# Patient Record
Sex: Female | Born: 1950 | Race: White | Hispanic: No | Marital: Married | Smoking: Never smoker
Health system: Southern US, Community
[De-identification: ages and names within clinical notes are randomized; demographics above are authoritative.]

## PROBLEM LIST (undated history)

## (undated) DIAGNOSIS — K635 Polyp of colon: Secondary | ICD-10-CM

## (undated) DIAGNOSIS — IMO0002 Reserved for concepts with insufficient information to code with codable children: Secondary | ICD-10-CM

## (undated) DIAGNOSIS — Z79890 Hormone replacement therapy (postmenopausal): Secondary | ICD-10-CM

## (undated) DIAGNOSIS — R87619 Unspecified abnormal cytological findings in specimens from cervix uteri: Secondary | ICD-10-CM

## (undated) DIAGNOSIS — I10 Essential (primary) hypertension: Secondary | ICD-10-CM

## (undated) DIAGNOSIS — M1990 Unspecified osteoarthritis, unspecified site: Secondary | ICD-10-CM

## (undated) DIAGNOSIS — R87629 Unspecified abnormal cytological findings in specimens from vagina: Secondary | ICD-10-CM

## (undated) DIAGNOSIS — Z889 Allergy status to unspecified drugs, medicaments and biological substances status: Secondary | ICD-10-CM

## (undated) HISTORY — DX: Unspecified osteoarthritis, unspecified site: M19.90

## (undated) HISTORY — DX: Unspecified abnormal cytological findings in specimens from vagina: R87.629

## (undated) HISTORY — PX: OTHER SURGICAL HISTORY: SHX169

## (undated) HISTORY — DX: Reserved for concepts with insufficient information to code with codable children: IMO0002

## (undated) HISTORY — PX: SHOULDER SURGERY: SHX246

## (undated) HISTORY — DX: Unspecified abnormal cytological findings in specimens from cervix uteri: R87.619

## (undated) HISTORY — DX: Allergy status to unspecified drugs, medicaments and biological substances: Z88.9

## (undated) HISTORY — PX: WRIST SURGERY: SHX841

## (undated) HISTORY — DX: Hormone replacement therapy: Z79.890

---

## 2001-02-06 ENCOUNTER — Ambulatory Visit (HOSPITAL_COMMUNITY): Admission: RE | Admit: 2001-02-06 | Discharge: 2001-02-06 | Payer: Self-pay | Admitting: Obstetrics and Gynecology

## 2001-02-06 ENCOUNTER — Encounter: Payer: Self-pay | Admitting: Obstetrics and Gynecology

## 2001-02-27 ENCOUNTER — Other Ambulatory Visit: Admission: RE | Admit: 2001-02-27 | Discharge: 2001-02-27 | Payer: Self-pay | Admitting: Obstetrics and Gynecology

## 2002-04-03 ENCOUNTER — Encounter: Payer: Self-pay | Admitting: Specialist

## 2002-04-03 ENCOUNTER — Ambulatory Visit (HOSPITAL_COMMUNITY): Admission: RE | Admit: 2002-04-03 | Discharge: 2002-04-03 | Payer: Self-pay | Admitting: Specialist

## 2003-01-16 ENCOUNTER — Ambulatory Visit (HOSPITAL_COMMUNITY): Admission: RE | Admit: 2003-01-16 | Discharge: 2003-01-16 | Payer: Self-pay | Admitting: Internal Medicine

## 2003-01-16 ENCOUNTER — Encounter: Payer: Self-pay | Admitting: Internal Medicine

## 2003-04-09 ENCOUNTER — Encounter: Payer: Self-pay | Admitting: Specialist

## 2003-04-09 ENCOUNTER — Ambulatory Visit (HOSPITAL_COMMUNITY): Admission: RE | Admit: 2003-04-09 | Discharge: 2003-04-09 | Payer: Self-pay | Admitting: Specialist

## 2004-04-27 ENCOUNTER — Ambulatory Visit (HOSPITAL_COMMUNITY): Admission: RE | Admit: 2004-04-27 | Discharge: 2004-04-27 | Payer: Self-pay | Admitting: Specialist

## 2005-06-01 ENCOUNTER — Ambulatory Visit (HOSPITAL_COMMUNITY): Admission: RE | Admit: 2005-06-01 | Discharge: 2005-06-01 | Payer: Self-pay | Admitting: Specialist

## 2006-06-11 ENCOUNTER — Ambulatory Visit (HOSPITAL_COMMUNITY): Admission: RE | Admit: 2006-06-11 | Discharge: 2006-06-11 | Payer: Self-pay | Admitting: Specialist

## 2007-06-13 ENCOUNTER — Ambulatory Visit (HOSPITAL_COMMUNITY): Admission: RE | Admit: 2007-06-13 | Discharge: 2007-06-13 | Payer: Self-pay | Admitting: Specialist

## 2007-09-05 ENCOUNTER — Other Ambulatory Visit: Admission: RE | Admit: 2007-09-05 | Discharge: 2007-09-05 | Payer: Self-pay | Admitting: Obstetrics & Gynecology

## 2007-10-17 ENCOUNTER — Ambulatory Visit (HOSPITAL_COMMUNITY): Admission: RE | Admit: 2007-10-17 | Discharge: 2007-10-17 | Payer: Self-pay | Admitting: Internal Medicine

## 2008-06-16 ENCOUNTER — Ambulatory Visit (HOSPITAL_COMMUNITY): Admission: RE | Admit: 2008-06-16 | Discharge: 2008-06-16 | Payer: Self-pay | Admitting: Obstetrics & Gynecology

## 2008-09-22 ENCOUNTER — Other Ambulatory Visit: Admission: RE | Admit: 2008-09-22 | Discharge: 2008-09-22 | Payer: Self-pay | Admitting: Obstetrics & Gynecology

## 2009-02-18 ENCOUNTER — Encounter: Payer: Self-pay | Admitting: Internal Medicine

## 2009-02-24 ENCOUNTER — Ambulatory Visit (HOSPITAL_COMMUNITY): Admission: RE | Admit: 2009-02-24 | Discharge: 2009-02-24 | Payer: Self-pay | Admitting: Internal Medicine

## 2009-02-24 ENCOUNTER — Ambulatory Visit: Payer: Self-pay | Admitting: Internal Medicine

## 2009-06-18 ENCOUNTER — Ambulatory Visit (HOSPITAL_COMMUNITY): Admission: RE | Admit: 2009-06-18 | Discharge: 2009-06-18 | Payer: Self-pay | Admitting: Obstetrics & Gynecology

## 2009-07-08 ENCOUNTER — Ambulatory Visit (HOSPITAL_COMMUNITY): Admission: RE | Admit: 2009-07-08 | Discharge: 2009-07-08 | Payer: Self-pay | Admitting: Obstetrics & Gynecology

## 2009-07-28 ENCOUNTER — Ambulatory Visit (HOSPITAL_COMMUNITY): Admission: RE | Admit: 2009-07-28 | Discharge: 2009-07-28 | Payer: Self-pay | Admitting: Internal Medicine

## 2009-10-18 ENCOUNTER — Other Ambulatory Visit: Admission: RE | Admit: 2009-10-18 | Discharge: 2009-10-18 | Payer: Self-pay | Admitting: Obstetrics & Gynecology

## 2010-05-18 ENCOUNTER — Encounter: Admission: RE | Admit: 2010-05-18 | Discharge: 2010-05-18 | Payer: Self-pay | Admitting: Orthopedic Surgery

## 2010-06-22 ENCOUNTER — Ambulatory Visit
Admission: RE | Admit: 2010-06-22 | Discharge: 2010-06-22 | Payer: Self-pay | Source: Home / Self Care | Admitting: Orthopedic Surgery

## 2010-07-25 ENCOUNTER — Ambulatory Visit (HOSPITAL_COMMUNITY)
Admission: RE | Admit: 2010-07-25 | Discharge: 2010-07-25 | Payer: Self-pay | Source: Home / Self Care | Admitting: Obstetrics & Gynecology

## 2010-09-10 ENCOUNTER — Encounter: Payer: Self-pay | Admitting: Obstetrics & Gynecology

## 2010-09-10 ENCOUNTER — Encounter: Payer: Self-pay | Admitting: Specialist

## 2010-10-10 NOTE — Op Note (Signed)
NAMECARMELL, [name]               ACCOUNT NO.:  [id number]  MEDICAL RECORD NO.:  [id number]          PATIENT TYPE:  AMB  LOCATION:  DSC                          FACILITY:  [facility]  PHYSICIAN:  Cindee Salt, M.D.       DATE OF BIRTH:  January 26, 1951  DATE OF PROCEDURE:  06/22/2010 DATE OF DISCHARGE:                              OPERATIVE REPORT   PREOPERATIVE DIAGNOSIS:  Triangular fibrocartilage complex tears, scapholunate lunotriquetral tear with volar ulnar ligament avulsion, right wrist.  POSTOPERATIVE DIAGNOSIS:  Triangular fibrocartilage complex tears, scapholunate lunotriquetral tear with volar ulnar ligament avulsion, right wrist.  OPERATION:  Arthroscopy, right wrist with debridement triangular fibrocartilage complex tear, lateral triquetral tear, lunotriquetral tear, a volar ulnar wrist ligament avulsion, right wrist.  SURGEON:  Cindee Salt, MD  ASSISTANT:  Joaquin Courts, RN  ANESTHESIA:  General.  ANESTHESIOLOGIST:  Janetta Hora. Gelene Mink, MD  HISTORY:  The patient is a 60 year old female with a history of right wrist pain.  This has not responded to conservative treatment.  MRI reveals that she has a TFCC tear with cyst at the volar aspect of her lunate triquetrum indicative of a volar ulnar ligament injury.  She has scapholunate and lunotriquetral ligament tears.  She is aware that there is no guarantee with the surgery, possibility of infection, recurrence, injury to arteries, nerves, tendons, incomplete relief of symptoms, or dystrophy.  She has elected to undergo arthroscopic inspection, possible repairs, possibility of simple debridement with more definitive procedure at some point in the future determined by findings and by symptom resolution.  In the preoperative area, the patient is seen, the extremity marked by both the patient and surgeon, and antibiotic given.  PROCEDURE IN DETAIL:  The patient was brought to the operating room where a general anesthetic was  carried out without difficulty after a axillary block.  She was prepped using ChloraPrep, supine position with the right arm free.  A 3-minute dry time was allowed and time-out taken confirming the patient and procedure.  The limb was placed in the ARC arthroscopy tower and 10 pounds of traction applied.  The joint inflated through the 3-4 portal.  Care was taken to protect the EPL tendon.  A hemostat was used to enter the spread tissue down to the joint.  A blunt trocar was used to enter the joint.  The joint was inspected.  A large scapholunate ligament tear with significant rotation of scaphoid lunate was immediately apparent.  A moderate synovitis was present over the entire wrist.  The scope was then brought to the ulnar aspect.  The irrigation catheter placed in 6U.  A large volar ulnar ligament tear was immediately apparent of both the ulnar lunate and the ulnar triquetral ligaments.  These were brought down into the joint and were significantly shortened.  A probe was then introduced through the 4-5 portal after localization with a 22-gauge needle.  This allowed the ligaments to be pushed back up into a normal position, although barely able to reach the lunate.  The scope was then introduced in the 6R portal.  This allowed visualization of the volar portion of the  lunate. A large defect of articular surface was present on the lunate.  A small TFCC tear was present on the most volar aspect, not involving the condensation.  The scope was then introduced into the tear on the volar aspect.  The cystic change in the lunate was immediately apparent.  This was unable to be probed due to its location on the volar aspect.  The scope was then reintroduced in the 3-4 portal.  The TFCC was probed. The ulna was not visible.  The joint was then inspected through the ulnar midcarpal portal after localization with a 22-gauge needle. Transverse incision made and deepened with a hemostat.  A blunt  trocar was used to enter the joint and the joint was inspected.  A very significant erosion of the proximal hamate was noted, instability of both scapholunate lunotriquetral joints were noted from the midcarpal joint.  The proximal capitate showed a very minimal change on its most ulnar aspect.  The scope was reintroduced into the proximal 3-4 portal. The articular surface of the distal radius lunate facet showed some minor changes, but the articular of cartilage was present.  A thorough debridement was then performed of the lunotriquetral, the volar ulnar ligaments, the TFCC, and the scapholunate with a coude shaver.  A ArthroWand was then used for hemostasis.  The instruments were removed. The portals were closed with interrupted 5-0 Vicryl Rapide sutures. Sterile compressive dressing, volar splint applied to the wrist only with the fingers free.  The patient tolerated the procedure well and was taken to the recovery room for observation in satisfactory condition. She will be discharged to home to return to the [facility] [place] in 1 week on Vicodin.          ______________________________ Cindee Salt, M.D.     GK/MEDQ  D:  06/22/2010  T:  06/23/2010  Job:  [id number]  cc:   Kingsley Callander. Ouida Sills, MD  Electronically Signed by Cindee Salt M.D. on 10/10/2010 12:20:15 PM

## 2010-10-21 ENCOUNTER — Other Ambulatory Visit: Payer: Self-pay | Admitting: Obstetrics & Gynecology

## 2010-10-21 ENCOUNTER — Other Ambulatory Visit (HOSPITAL_COMMUNITY)
Admission: RE | Admit: 2010-10-21 | Discharge: 2010-10-21 | Disposition: A | Discharge status: Home / Self Care | Payer: 59 | Source: Ambulatory Visit | Attending: Obstetrics & Gynecology | Admitting: Obstetrics & Gynecology

## 2010-10-21 DIAGNOSIS — Z01419 Encounter for gynecological examination (general) (routine) without abnormal findings: Secondary | ICD-10-CM | POA: Insufficient documentation

## 2010-11-01 LAB — POCT HEMOGLOBIN-HEMACUE: Hemoglobin: 13 g/dL (ref 12.0–15.0)

## 2011-01-03 NOTE — Op Note (Signed)
[name], MERIDA               ACCOUNT NO.:  [id number]   MEDICAL RECORD NO.:  [id number]          PATIENT TYPE:  AMB   LOCATION:  DAY                           FACILITY:  [facility]   PHYSICIAN:  R. Roetta Sessions, M.D. DATE OF BIRTH:  August 05, 1951   DATE OF PROCEDURE:  02/24/2009  DATE OF DISCHARGE:                               OPERATIVE REPORT   PROCEDURE:  Screening colonoscopy.   INDICATIONS FOR PROCEDURE:  A 60 year old lady sent over at the courtesy  Dr. Carylon Perches for colorectal cancer screening.  She is devoid of any  lower GI tract symptoms.  She has never had her lower GI tract  evaluated.  Reportedly, her sister was diagnosed with colon cancer in  her late 55s and her mother was diagnosed in her 34s.  [name] is now  undergoing her first ever colonoscopy.  The risks, benefits,  alternatives and limitations have been discussed, questions answered.  Please see the documentation in the medical record.   PROCEDURE NOTE:  O2 saturation, blood pressure and pulse oximeter.  The  patient was monitored throughout the entire procedure.  Conscious  sedation with Versed 5 mg IV and Demerol 75 mg IV in divided doses.   INSTRUMENT:  Pentax video chip system.   FINDINGS:  Digital rectal exam revealed no abnormalities.   ENDOSCOPIC FINDINGS:  Prep was adequate.  Colon:  The colonic mucosa was  surveyed from the rectosigmoid junction through the left transverse  right colon to the appendiceal orifice, ileocecal valve and cecum.  These structures were well seen and photographed for the record.  From  this level, the scope was slowly and cautiously withdrawn.  All  previously mentioned mucosal surfaces were again seen.  The colonic  mucosa appeared normal.  The scope was pulled down in the rectum where a  thorough examination of the rectal mucosa, including retroflexion of the  anal verge demonstrated no abnormalities.  The patient tolerated the  procedure well and was reacted in  endoscopy.  Cecal withdrawal time 6  minutes.   IMPRESSION:  1. Normal rectum.  2. Normal colon.   RECOMMENDATIONS:  Repeat screening colonoscopy in 5 years.      Jonathon Bellows, M.D.  Electronically Signed     RMR/MEDQ  D:  02/24/2009  T:  02/24/2009  Job:  [id number]   cc:   Kingsley Callander. Ouida Sills, MD  Fax: [phone]

## 2011-05-22 ENCOUNTER — Ambulatory Visit (HOSPITAL_BASED_OUTPATIENT_CLINIC_OR_DEPARTMENT_OTHER)
Admission: RE | Admit: 2011-05-22 | Discharge: 2011-05-22 | Disposition: A | Discharge status: Home / Self Care | Payer: Managed Care, Other (non HMO) | Source: Ambulatory Visit | Attending: Orthopedic Surgery | Admitting: Orthopedic Surgery

## 2011-05-22 DIAGNOSIS — M6530 Trigger finger, unspecified finger: Secondary | ICD-10-CM | POA: Insufficient documentation

## 2011-05-22 DIAGNOSIS — M65839 Other synovitis and tenosynovitis, unspecified forearm: Secondary | ICD-10-CM | POA: Insufficient documentation

## 2011-05-22 LAB — POCT HEMOGLOBIN-HEMACUE: Hemoglobin: 13 g/dL (ref 12.0–15.0)

## 2011-05-23 NOTE — Op Note (Signed)
  NAMESHALISA, [name] NO.:  [id number]  MEDICAL RECORD NO.:  [id number]  LOCATION:                                 FACILITY:  PHYSICIAN:  Cindee Salt, M.D.            DATE OF BIRTH:  DATE OF PROCEDURE:  05/22/2011 DATE OF DISCHARGE:                              OPERATIVE REPORT   PREOPERATIVE DIAGNOSIS:  Stenosing tenosynovitis right thumb.  POSTOPERATIVE DIAGNOSIS:  Stenosing tenosynovitis right thumb.  OPERATION:  Release A1 pulley right thumb.  SURGEON:  Cindee Salt, MD  ANESTHESIA:  Forearm based IV regional with local filtration.  ANESTHESIOLOGIST:  Janetta Hora. Gelene Mink, MD  HISTORY:  The patient is a 60 year old female with a history of triggering of her right thumb.  This has not responded to conservative treatment.  Pre, peri and postoperative course have been discussed along with risks and complications.  She is aware there is no guarantee with surgery, possibility of infection, recurrence injury to arteries, nerves, tendons, incomplete relief of symptoms and dystrophy. Preoperative area, the patient is seen.  The extremity marked by both the patient and surgeon.  Antibiotic given.  PROCEDURE:  The patient was brought to the operating room where a forearm based IV regional anesthetic was carried out without difficulty. She was prepped using ChloraPrep, supine position, right arm free.  A 3- minute dry time was allowed.  Time-out taken confirming the patient and procedure.  A longitudinal transverse incision was made over the A1 pulley of the right thumb, carried down through subcutaneous tissue. Bleeders were electrocauterized with bipolar.  The digital neurovascular bundles were identified and protected.  Retractors were placed.  The A1 pulley was then released on its radial aspect.  A locked area was immediately encountered.  After release, the thumb was able to be placed through a full range of motion, no further lesions were identified.   The wound was irrigated.  Skin closed with interrupted 4-0 Vicryl Rapide sutures.  Local infiltration of 0.25% Marcaine without epinephrine was given, approximately 4 mL was used.  A sterile compressive dressing was applied.  On deflation of the tourniquet, all fingers immediately pinked.  She was taken to the recovery room for observation in satisfactory condition. She will be discharged home to return to the [facility] of [place] in 1 week on Vicodin.          ______________________________ Cindee Salt, M.D.     GK/MEDQ  D:  05/22/2011  T:  05/23/2011  Job:  [id number]  Electronically Signed by Cindee Salt M.D. on 05/23/2011 04:41:17 PM

## 2011-06-12 ENCOUNTER — Emergency Department (HOSPITAL_COMMUNITY): Payer: Managed Care, Other (non HMO)

## 2011-06-12 ENCOUNTER — Emergency Department (HOSPITAL_COMMUNITY)
Admission: EM | Admit: 2011-06-12 | Discharge: 2011-06-12 | Disposition: A | Discharge status: Home / Self Care | Payer: Managed Care, Other (non HMO) | Attending: Emergency Medicine | Admitting: Emergency Medicine

## 2011-06-12 ENCOUNTER — Encounter: Payer: Self-pay | Admitting: *Deleted

## 2011-06-12 DIAGNOSIS — W010XXA Fall on same level from slipping, tripping and stumbling without subsequent striking against object, initial encounter: Secondary | ICD-10-CM | POA: Insufficient documentation

## 2011-06-12 DIAGNOSIS — S82892A Other fracture of left lower leg, initial encounter for closed fracture: Secondary | ICD-10-CM

## 2011-06-12 DIAGNOSIS — S82899A Other fracture of unspecified lower leg, initial encounter for closed fracture: Secondary | ICD-10-CM | POA: Insufficient documentation

## 2011-06-12 MED ORDER — OXYCODONE-ACETAMINOPHEN 5-325 MG PO TABS: 1.0000 | ORAL_TABLET | ORAL | Status: AC | PRN

## 2011-06-12 MED ORDER — OXYCODONE-ACETAMINOPHEN 5-325 MG PO TABS
1.0000 | ORAL_TABLET | Freq: Once | ORAL | Status: AC
  Administered 2011-06-12: 1 via ORAL
  Filled 2011-06-12: qty 1

## 2011-06-12 NOTE — ED Provider Notes (Signed)
History     CSN: [id number] Arrival date & time: 06/12/2011  7:30 PM   First MD Initiated Contact with Patient 06/12/11 2000      Chief Complaint  Patient presents with  . Ankle Pain    (Consider location/radiation/quality/duration/timing/severity/associated sxs/prior treatment) Patient is a 60 y.o. female presenting with ankle pain. The history is provided by the patient and the spouse.  Ankle Pain  The incident occurred 3 to 5 hours ago. The incident occurred at home. The injury mechanism was a fall and torsion. The pain is present in the left ankle. The quality of the pain is described as aching and throbbing. The pain is moderate. The pain has been constant since onset. Associated symptoms include inability to bear weight. Pertinent negatives include no numbness, no loss of motion, no muscle weakness, no loss of sensation and no tingling. She reports no foreign bodies present. The symptoms are aggravated by activity, bearing weight and palpation. She has tried NSAIDs for the symptoms. The treatment provided no relief.    History reviewed. No pertinent past medical history.  Past Surgical History  Procedure Date  . Wrist surgery   . Shoulder surgery   . Thumb surger     No family history on file.  History  Substance Use Topics  . Smoking status: Never Smoker   . Smokeless tobacco: Not on file  . Alcohol Use: No    OB History    Grav Para Term Preterm Abortions TAB SAB Ect Mult Living                  Review of Systems  Constitutional: Negative for fever, chills and fatigue.  HENT: Negative for neck pain and neck stiffness.   Respiratory: Negative for cough, shortness of breath and wheezing.   Cardiovascular: Negative for chest pain and palpitations.  Gastrointestinal: Negative for vomiting.  Genitourinary: Negative for dysuria, hematuria and flank pain.  Musculoskeletal: Positive for joint swelling and arthralgias. Negative for myalgias and back pain.  Skin:  Negative.  Negative for rash.  Neurological: Negative for dizziness, tingling, weakness and numbness.  Hematological: Does not bruise/bleed easily.  All other systems reviewed and are negative.    Allergies  Review of patient's allergies indicates no known allergies.  Home Medications   Current Outpatient Rx  Name Route Sig Dispense Refill  . CALCIUM 600 + D PO Oral Take 1 tablet by mouth daily.      . IBUPROFEN 200 MG PO TABS Oral Take 200 mg by mouth as needed. For pain     . ONE-A-DAY WOMENS 50 PLUS PO Oral Take 1 tablet by mouth daily.      Janetta Hora ESTRADIOL 0.5-2.5 MG-MCG PO TABS Oral Take 1 tablet by mouth daily.        BP 152/76  Pulse 79  Temp(Src) 98.2 F (36.8 C) (Oral)  Resp 16  Ht 5\' 2"  (1.575 m)  Wt 125 lb (56.7 kg)  BMI 22.86 kg/m2  SpO2 100%  Physical Exam  Nursing note and vitals reviewed. Constitutional: She is oriented to person, place, and time. She appears well-developed and well-nourished. No distress.  HENT:  Head: Normocephalic and atraumatic.  Mouth/Throat: Oropharynx is clear and moist.  Neck: Normal range of motion. Neck supple.  Cardiovascular: Normal rate, regular rhythm and normal heart sounds.   Pulmonary/Chest: Effort normal and breath sounds normal. No respiratory distress. She exhibits no tenderness.  Musculoskeletal: Normal range of motion. She exhibits no tenderness.  Left ankle: She exhibits swelling. She exhibits normal range of motion, no ecchymosis, no deformity, no laceration and normal pulse. tenderness. Lateral malleolus and medial malleolus tenderness found. No head of 5th metatarsal and no proximal fibula tenderness found. Achilles tendon normal.  Lymphadenopathy:    She has no cervical adenopathy.  Neurological: She is alert and oriented to person, place, and time. No cranial nerve deficit or sensory deficit. She exhibits normal muscle tone. Coordination normal.  Reflex Scores:      Achilles reflexes are 2+ on  the right side and 2+ on the left side. Skin: Skin is warm and dry.    ED Course  ORTHOPEDIC INJURY TREATMENT Date/Time: 06/12/2011 8:55 PM Performed by: Trisha Mangle, Edynn Gillock L. Authorized by: Maxwell Caul Consent: Verbal consent obtained. Written consent not obtained. Consent given by: patient Patient understanding: patient states understanding of the procedure being performed Patient consent: the patient's understanding of the procedure matches consent given Procedure consent: procedure consent matches procedure scheduled Imaging studies: imaging studies available Patient identity confirmed: verbally with patient Time out: Immediately prior to procedure a "time out" was called to verify the correct patient, procedure, equipment, support staff and site/side marked as required. Injury location: ankle Location details: left ankle Injury type: fracture Fracture type: lateral malleolus Pre-procedure neurovascular assessment: neurovascularly intact Pre-procedure distal perfusion: normal Pre-procedure neurological function: normal Pre-procedure range of motion: reduced Local anesthesia used: no Patient sedated: no Manipulation performed: no Immobilization: splint and crutches Splint type: velcro aso. Post-procedure neurovascular assessment: post-procedure neurovascularly intact Post-procedure distal perfusion: normal Post-procedure neurological function: normal Post-procedure range of motion: unchanged Patient tolerance: Patient tolerated the procedure well with no immediate complications.   (including critical care time)  Dg Ankle Complete Left  06/12/2011  *RADIOLOGY REPORT*  Clinical Data: Pain and swelling of the lateral malleolus.  The patient twisted ankle and fell today.  LEFT ANKLE COMPLETE - 3+ VIEW  Comparison: None.  Findings: There is soft tissue swelling along the lateral aspect of the ankle.  Faint bone density is identified distal to the lateral malleolus, consist  with small avulsion type injury.  No other fractures are identified.  Small plantar are calcaneal spur identified.  IMPRESSION:  Small avulsion type injury of the lateral malleolus.  Original Report Authenticated By: Patterson Hammersmith, M.D.        MDM     ttp  With moderate STS of the lateral left ankle.  DP pulse is intact, sensation is also intact.  CR<2 sec.  PAtien feeling better after splint applied.  Agrees to close ortho follow-up.    Patient / Family / Caregiver understand and agree with initial ED impression and plan with expectations set for ED visit.   OUTPATIENT MEDICATIONS PRESCRIBED FROM THE ED:   New Prescriptions   OXYCODONE-ACETAMINOPHEN (PERCOCET) 5-325 MG PER TABLET    Take 1 tablet by mouth every 4 (four) hours as needed for pain.            Malike Foglio L. Anyssa Sharpless, [place] 06/16/11 1431

## 2011-06-12 NOTE — ED Notes (Signed)
Pt states she slipped on wet leaves and left ankle twisted. NAD

## 2011-06-20 NOTE — ED Provider Notes (Signed)
Medical screening examination/treatment/procedure(s) were conducted as a shared visit with non-physician practitioner(s) and myself.  I personally evaluated the patient during the encounter.  X-ray of left ankle shows small avulsion of lateral malleolus.  Neurovascular intact  Donnetta Hutching, MD 06/20/11 [phone]

## 2011-07-24 ENCOUNTER — Other Ambulatory Visit: Payer: Self-pay | Admitting: Obstetrics & Gynecology

## 2011-07-24 DIAGNOSIS — Z139 Encounter for screening, unspecified: Secondary | ICD-10-CM

## 2011-07-27 ENCOUNTER — Ambulatory Visit (HOSPITAL_COMMUNITY)
Admission: RE | Admit: 2011-07-27 | Discharge: 2011-07-27 | Disposition: A | Discharge status: Home / Self Care | Payer: 59 | Source: Ambulatory Visit | Attending: Obstetrics & Gynecology | Admitting: Obstetrics & Gynecology

## 2011-07-27 DIAGNOSIS — Z139 Encounter for screening, unspecified: Secondary | ICD-10-CM

## 2011-07-27 DIAGNOSIS — Z1231 Encounter for screening mammogram for malignant neoplasm of breast: Secondary | ICD-10-CM | POA: Insufficient documentation

## 2011-11-07 ENCOUNTER — Other Ambulatory Visit (HOSPITAL_COMMUNITY)
Admission: RE | Admit: 2011-11-07 | Discharge: 2011-11-07 | Disposition: A | Discharge status: Home / Self Care | Payer: 59 | Source: Ambulatory Visit | Attending: Obstetrics & Gynecology | Admitting: Obstetrics & Gynecology

## 2011-11-07 ENCOUNTER — Other Ambulatory Visit: Payer: Self-pay | Admitting: Obstetrics & Gynecology

## 2011-11-07 DIAGNOSIS — Z01419 Encounter for gynecological examination (general) (routine) without abnormal findings: Secondary | ICD-10-CM | POA: Insufficient documentation

## 2012-02-26 ENCOUNTER — Other Ambulatory Visit: Payer: Self-pay | Admitting: Obstetrics & Gynecology

## 2012-07-02 ENCOUNTER — Other Ambulatory Visit: Payer: Self-pay | Admitting: Obstetrics & Gynecology

## 2012-07-02 DIAGNOSIS — IMO0001 Reserved for inherently not codable concepts without codable children: Secondary | ICD-10-CM

## 2012-07-29 ENCOUNTER — Ambulatory Visit (HOSPITAL_COMMUNITY)
Admission: RE | Admit: 2012-07-29 | Discharge: 2012-07-29 | Disposition: A | Discharge status: Home / Self Care | Payer: 59 | Source: Ambulatory Visit | Attending: Obstetrics & Gynecology | Admitting: Obstetrics & Gynecology

## 2012-07-29 DIAGNOSIS — IMO0001 Reserved for inherently not codable concepts without codable children: Secondary | ICD-10-CM

## 2012-07-29 DIAGNOSIS — Z1231 Encounter for screening mammogram for malignant neoplasm of breast: Secondary | ICD-10-CM | POA: Insufficient documentation

## 2012-11-14 ENCOUNTER — Encounter: Payer: Self-pay | Admitting: Adult Health

## 2012-11-14 ENCOUNTER — Other Ambulatory Visit (HOSPITAL_COMMUNITY)
Admission: RE | Admit: 2012-11-14 | Discharge: 2012-11-14 | Disposition: A | Discharge status: Home / Self Care | Payer: 59 | Source: Ambulatory Visit | Attending: Obstetrics and Gynecology | Admitting: Obstetrics and Gynecology

## 2012-11-14 ENCOUNTER — Ambulatory Visit (INDEPENDENT_AMBULATORY_CARE_PROVIDER_SITE_OTHER): Payer: PRIVATE HEALTH INSURANCE | Admitting: Adult Health

## 2012-11-14 ENCOUNTER — Other Ambulatory Visit: Payer: Self-pay | Admitting: Adult Health

## 2012-11-14 VITALS — BP 120/80 | HR 68 | Ht 62.0 in | Wt 134.0 lb

## 2012-11-14 DIAGNOSIS — Z01419 Encounter for gynecological examination (general) (routine) without abnormal findings: Secondary | ICD-10-CM | POA: Insufficient documentation

## 2012-11-14 DIAGNOSIS — M1990 Unspecified osteoarthritis, unspecified site: Secondary | ICD-10-CM

## 2012-11-14 DIAGNOSIS — Z1151 Encounter for screening for human papillomavirus (HPV): Secondary | ICD-10-CM | POA: Insufficient documentation

## 2012-11-14 DIAGNOSIS — Z79890 Hormone replacement therapy (postmenopausal): Secondary | ICD-10-CM

## 2012-11-14 DIAGNOSIS — Z0000 Encounter for general adult medical examination without abnormal findings: Secondary | ICD-10-CM

## 2012-11-14 DIAGNOSIS — Z1212 Encounter for screening for malignant neoplasm of rectum: Secondary | ICD-10-CM

## 2012-11-14 HISTORY — DX: Hormone replacement therapy: Z79.890

## 2012-11-14 LAB — HEMOCCULT GUIAC POC 1CARD (OFFICE)

## 2012-11-14 NOTE — Progress Notes (Signed)
Subjective:     Patient ID: [name], female   DOB: 04/25/1951, 62 y.o.   MRN: [id number]  HPI Shanikia is a 62 year old white female, married in today for Pap and physical.   Review of Systems Patient denies any headaches, blurred vision, shortness of breath, chest pain, abdominal pain, problems with bowel movements, urination, or intercourse. Does have some pain in knees and hands occasionally. No mood changes. Reviewed past medical, surgical, social and family histories. Reviewed medications and allergies.     Objective:   Physical ExamVital signs: Blood pressure 120/80. weight 134 lbs., height 62 inches, pulse 68 and regular BMI 24. Skin: Warm and dry Neck: Midline trachea. Thyroid normal Lungs: Clear to auscultation bilaterally. Breasts: No dominant palpable mass, retraction, or nipple discharge. Cardiovascular: Regular rate and rhythm. Abdomen: Soft and non-tender, no hepatosplenomegaly. Pelvic: External genitalia is normal and appearance. The vagina is normal in appearance. Cervix is slightly atrophic with negative cervical motion tenderness. Uterus felt to be normal size shape and contour. No adnexal masses or tenderness noted. Rectal exam:Good sphincter, no polyps or hemorrhoids. Negative hemoccult card. Extremities: No swelling or varicose veins. Psych: No mood changes     Assessment:      Yearly exam     Plan:      Discussed stopping hormone replacement therapy, patient thinks she will stop after current pack, if any problems call. Mammogram yearly(had in 07-2012). Colonoscopy per GI.  Labs per Dr. Ouida Sills   Return in 1 year physical, prn problems.

## 2012-11-14 NOTE — Patient Instructions (Addendum)
Ok to try stopping hormones, mammogram yearly, colonoscopy per GI, Dr. Ouida Sills does labs. Physical yearly, if HPV - 3 years for pap, Sign up for my chart.

## 2013-04-02 ENCOUNTER — Other Ambulatory Visit (HOSPITAL_COMMUNITY): Payer: Self-pay | Admitting: Orthopaedic Surgery

## 2013-04-02 DIAGNOSIS — M25562 Pain in left knee: Secondary | ICD-10-CM

## 2013-04-07 ENCOUNTER — Other Ambulatory Visit (HOSPITAL_COMMUNITY): Payer: 59

## 2013-04-07 ENCOUNTER — Ambulatory Visit (HOSPITAL_COMMUNITY)
Admission: RE | Admit: 2013-04-07 | Discharge: 2013-04-07 | Disposition: A | Discharge status: Home / Self Care | Payer: 59 | Source: Ambulatory Visit | Attending: Orthopaedic Surgery | Admitting: Orthopaedic Surgery

## 2013-04-07 DIAGNOSIS — X58XXXA Exposure to other specified factors, initial encounter: Secondary | ICD-10-CM | POA: Insufficient documentation

## 2013-04-07 DIAGNOSIS — IMO0002 Reserved for concepts with insufficient information to code with codable children: Secondary | ICD-10-CM | POA: Insufficient documentation

## 2013-04-07 DIAGNOSIS — M7120 Synovial cyst of popliteal space [Baker], unspecified knee: Secondary | ICD-10-CM | POA: Insufficient documentation

## 2013-04-07 DIAGNOSIS — M25569 Pain in unspecified knee: Secondary | ICD-10-CM | POA: Insufficient documentation

## 2013-04-07 DIAGNOSIS — S83419A Sprain of medial collateral ligament of unspecified knee, initial encounter: Secondary | ICD-10-CM | POA: Insufficient documentation

## 2013-04-07 DIAGNOSIS — M25562 Pain in left knee: Secondary | ICD-10-CM

## 2013-04-07 DIAGNOSIS — M1710 Unilateral primary osteoarthritis, unspecified knee: Secondary | ICD-10-CM | POA: Insufficient documentation

## 2013-07-02 ENCOUNTER — Other Ambulatory Visit (HOSPITAL_COMMUNITY): Payer: Self-pay | Admitting: Internal Medicine

## 2013-07-02 DIAGNOSIS — Z139 Encounter for screening, unspecified: Secondary | ICD-10-CM

## 2013-07-31 ENCOUNTER — Ambulatory Visit (HOSPITAL_COMMUNITY)
Admission: RE | Admit: 2013-07-31 | Discharge: 2013-07-31 | Disposition: A | Discharge status: Home / Self Care | Payer: 59 | Source: Ambulatory Visit | Attending: Internal Medicine | Admitting: Internal Medicine

## 2013-07-31 DIAGNOSIS — Z1231 Encounter for screening mammogram for malignant neoplasm of breast: Secondary | ICD-10-CM | POA: Insufficient documentation

## 2013-07-31 DIAGNOSIS — Z139 Encounter for screening, unspecified: Secondary | ICD-10-CM

## 2013-11-17 ENCOUNTER — Ambulatory Visit (INDEPENDENT_AMBULATORY_CARE_PROVIDER_SITE_OTHER): Payer: PRIVATE HEALTH INSURANCE | Admitting: Obstetrics & Gynecology

## 2013-11-17 ENCOUNTER — Other Ambulatory Visit (HOSPITAL_COMMUNITY)
Admission: RE | Admit: 2013-11-17 | Discharge: 2013-11-17 | Disposition: A | Discharge status: Home / Self Care | Payer: 59 | Source: Ambulatory Visit | Attending: Obstetrics & Gynecology | Admitting: Obstetrics & Gynecology

## 2013-11-17 ENCOUNTER — Other Ambulatory Visit: Payer: PRIVATE HEALTH INSURANCE | Admitting: Adult Health

## 2013-11-17 ENCOUNTER — Encounter: Payer: Self-pay | Admitting: Obstetrics & Gynecology

## 2013-11-17 ENCOUNTER — Other Ambulatory Visit: Payer: PRIVATE HEALTH INSURANCE | Admitting: Obstetrics and Gynecology

## 2013-11-17 VITALS — BP 130/70 | Ht 62.0 in | Wt 132.0 lb

## 2013-11-17 DIAGNOSIS — Z1212 Encounter for screening for malignant neoplasm of rectum: Secondary | ICD-10-CM

## 2013-11-17 DIAGNOSIS — Z01419 Encounter for gynecological examination (general) (routine) without abnormal findings: Secondary | ICD-10-CM

## 2013-11-17 NOTE — Progress Notes (Signed)
Patient ID: [name], female   DOB: [birth year], 63 y.o.   MRN: [id number] Subjective:     [name] is a 63 y.o. female here for a routine exam.  No LMP recorded. Patient is postmenopausal. G2P2 Birth Control Method:  na Menstrual Calendar(currently): na  Current complaints: na.   Current acute medical issues:  arthritis   Recent Gynecologic History No LMP recorded. Patient is postmenopausal. Last Pap: 2014,  normal Last mammogram: 2015,  normal  Past Medical History  Diagnosis Date  . Hx of seasonal allergies   . Arthritis   . Abnormal Pap smear   . Postmenopausal HRT (hormone replacement therapy) 11/14/2012    Past Surgical History  Procedure Laterality Date  . Wrist surgery    . Shoulder surgery    . Thumb surger    . Lt knee surgery      OB History   Grav Para Term Preterm Abortions TAB SAB Ect Mult Living   2 2        2       History   Social History  . Marital Status: Married    Spouse Name: N/A    Number of Children: N/A  . Years of Education: N/A   Social History Main Topics  . Smoking status: Never Smoker   . Smokeless tobacco: Never Used  . Alcohol Use: No     Comment: occ  . Drug Use: No  . Sexual Activity: Yes    Birth Control/ Protection: None, Post-menopausal   Other Topics Concern  . None   Social History Narrative  . None    Family History  Problem Relation Age of Onset  . Cancer Mother     colon  . Diabetes Mother   . Cancer Sister     colon  . Diabetes Maternal Aunt   . Depression Sister   . Arthritis Sister      Review of Systems  Review of Systems  Constitutional: Negative for fever, chills, weight loss, malaise/fatigue and diaphoresis.  HENT: Negative for hearing loss, ear pain, nosebleeds, congestion, sore throat, neck pain, tinnitus and ear discharge.   Eyes: Negative for blurred vision, double vision, photophobia, pain, discharge and redness.  Respiratory: Negative for cough, hemoptysis, sputum production,  shortness of breath, wheezing and stridor.   Cardiovascular: Negative for chest pain, palpitations, orthopnea, claudication, leg swelling and PND.  Gastrointestinal: negative for abdominal pain. Negative for heartburn, nausea, vomiting, diarrhea, constipation, blood in stool and melena.  Genitourinary: Negative for dysuria, urgency, frequency, hematuria and flank pain.  Musculoskeletal: Negative for myalgias, back pain, joint pain and falls.  Skin: Negative for itching and rash.  Neurological: Negative for dizziness, tingling, tremors, sensory change, speech change, focal weakness, seizures, loss of consciousness, weakness and headaches.  Endo/Heme/Allergies: Negative for environmental allergies and polydipsia. Does not bruise/bleed easily.  Psychiatric/Behavioral: Negative for depression, suicidal ideas, hallucinations, memory loss and substance abuse. The patient is not nervous/anxious and does not have insomnia.        Objective:    Physical Exam  Vitals reviewed. Constitutional: She is oriented to person, place, and time. She appears well-developed and well-nourished.  HENT:  Head: Normocephalic and atraumatic.        Right Ear: External ear normal.  Left Ear: External ear normal.  Nose: Nose normal.  Mouth/Throat: Oropharynx is clear and moist.  Eyes: Conjunctivae and EOM are normal. Pupils are equal, round, and reactive to light. Right eye exhibits no discharge. Left eye  exhibits no discharge. No scleral icterus.  Neck: Normal range of motion. Neck supple. No tracheal deviation present. No thyromegaly present.  Cardiovascular: Normal rate, regular rhythm, normal heart sounds and intact distal pulses.  Exam reveals no gallop and no friction rub.   No murmur heard. Respiratory: Effort normal and breath sounds normal. No respiratory distress. She has no wheezes. She has no rales. She exhibits no tenderness.  GI: Soft. Bowel sounds are normal. She exhibits no distension and no mass.  There is no tenderness. There is no rebound and no guarding.  Genitourinary:  Breasts no masses skin changes or nipple changes bilaterally      Vulva is normal without lesions Vagina is pink moist without discharge Cervix normal in appearance and pap is done Uterus is normal size shape and contour Adnexa is negative with normal sized ovaries  Rectal    hemoccult negative, normal tone, no masses  Musculoskeletal: Normal range of motion. She exhibits no edema and no tenderness.  Neurological: She is alert and oriented to person, place, and time. She has normal reflexes. She displays normal reflexes. No cranial nerve deficit. She exhibits normal muscle tone. Coordination normal.  Skin: Skin is warm and dry. No rash noted. No erythema. No pallor.  Psychiatric: She has a normal mood and affect. Her behavior is normal. Judgment and thought content normal.       Assessment:    Healthy female exam.    Plan:    fu 1 year

## 2014-02-25 ENCOUNTER — Encounter: Payer: Self-pay | Admitting: Internal Medicine

## 2014-03-24 ENCOUNTER — Telehealth: Payer: Self-pay

## 2014-03-24 NOTE — Telephone Encounter (Signed)
Called and spoke to husband and he will have pt call back tomorrow.

## 2014-03-24 NOTE — Telephone Encounter (Signed)
Pt is calling to set up her TCS because she got a letter in the mail. Her call back number is [phone]

## 2014-03-24 NOTE — Telephone Encounter (Signed)
Tried to call and line was busy.  

## 2014-03-25 ENCOUNTER — Other Ambulatory Visit: Payer: Self-pay

## 2014-03-25 DIAGNOSIS — Z1211 Encounter for screening for malignant neoplasm of colon: Secondary | ICD-10-CM

## 2014-03-25 MED ORDER — PEG-KCL-NACL-NASULF-NA ASC-C 100 G PO SOLR: 1.0000 | ORAL | Status: DC

## 2014-03-25 NOTE — Telephone Encounter (Signed)
Gastroenterology Pre-Procedure Review  Request Date: 03/25/2014 Requesting Physician: Dr. Willey Blade  PATIENT REVIEW QUESTIONS: The patient responded to the following health history questions as indicated:    Pt has family hx of colon cancer in her mother and sister Her last one was done by Dr. Gala Romney on 02/24/2009 and next recommended in 5 years.   1. Diabetes Melitis: no 2. Joint replacements in the past 12 months: no 3. Major health problems in the past 3 months: no 4. Has an artificial valve or MVP: no 5. Has a defibrillator: no 6. Has been advised in past to take antibiotics in advance of a procedure like teeth cleaning: no    MEDICATIONS & ALLERGIES:    Patient reports the following regarding taking any blood thinners:   Plavix? no Aspirin? no Coumadin? no  Patient confirms/reports the following medications:  Current Outpatient Prescriptions  Medication Sig Dispense Refill  . Calcium Carbonate-Vitamin D (CALCIUM 600 + D PO) Take 1 tablet by mouth 2 (two) times daily.       . cetirizine (ZYRTEC) 10 MG tablet Take 10 mg by mouth daily. As needed      . Multiple Vitamins-Minerals (ONE-A-DAY WOMENS 50 PLUS PO) Take 1 tablet by mouth daily.        . NON FORMULARY Aleve Arthritis as needed      . ibuprofen (ADVIL,MOTRIN) 200 MG tablet Take 200 mg by mouth as needed. For pain        No current facility-administered medications for this visit.    Patient confirms/reports the following allergies:  No Known Allergies  No orders of the defined types were placed in this encounter.    AUTHORIZATION INFORMATION Primary Insurance:   ID #:   Group #:  Pre-Cert / Auth required:  Pre-Cert / Auth #:   Secondary Insurance:   ID #:   Group #:  Pre-Cert / Auth required: Pre-Cert / Auth #:   SCHEDULE INFORMATION: Procedure has been scheduled as follows:  Date: 04/15/2014                   Time: 8:30 AM  Location: [facility] Short Stay  This Gastroenterology Pre-Precedure Review  Form is being routed to the following provider(s): R. Garfield Cornea, MD

## 2014-03-25 NOTE — Telephone Encounter (Signed)
Rx sent to the pharmacy and instructions mailed to pt.  

## 2014-03-25 NOTE — Telephone Encounter (Signed)
Appropriate.

## 2014-03-31 ENCOUNTER — Encounter (HOSPITAL_COMMUNITY): Payer: Self-pay | Admitting: Pharmacy Technician

## 2014-04-15 ENCOUNTER — Encounter (HOSPITAL_COMMUNITY)
Admission: RE | Disposition: A | Discharge status: Home / Self Care | Payer: Self-pay | Source: Ambulatory Visit | Attending: Internal Medicine

## 2014-04-15 ENCOUNTER — Ambulatory Visit (HOSPITAL_COMMUNITY)
Admission: RE | Admit: 2014-04-15 | Discharge: 2014-04-15 | Disposition: A | Discharge status: Home / Self Care | Payer: Medicare Other | Source: Ambulatory Visit | Attending: Internal Medicine | Admitting: Internal Medicine

## 2014-04-15 ENCOUNTER — Encounter (HOSPITAL_COMMUNITY): Payer: Self-pay | Admitting: *Deleted

## 2014-04-15 DIAGNOSIS — Z1211 Encounter for screening for malignant neoplasm of colon: Secondary | ICD-10-CM | POA: Diagnosis not present

## 2014-04-15 DIAGNOSIS — D126 Benign neoplasm of colon, unspecified: Secondary | ICD-10-CM | POA: Insufficient documentation

## 2014-04-15 DIAGNOSIS — Z800 Family history of malignant neoplasm of digestive organs: Secondary | ICD-10-CM | POA: Insufficient documentation

## 2014-04-15 DIAGNOSIS — Z79890 Hormone replacement therapy (postmenopausal): Secondary | ICD-10-CM | POA: Insufficient documentation

## 2014-04-15 HISTORY — PX: COLONOSCOPY: SHX5424

## 2014-04-15 SURGERY — COLONOSCOPY
Anesthesia: Moderate Sedation

## 2014-04-15 MED ORDER — MEPERIDINE HCL 100 MG/ML IJ SOLN
INTRAMUSCULAR | Status: AC
  Filled 2014-04-15: qty 2

## 2014-04-15 MED ORDER — MEPERIDINE HCL 100 MG/ML IJ SOLN
INTRAMUSCULAR | Status: DC | PRN
  Administered 2014-04-15: 50 mg via INTRAVENOUS
  Administered 2014-04-15: 25 mg via INTRAVENOUS

## 2014-04-15 MED ORDER — MIDAZOLAM HCL 5 MG/5ML IJ SOLN
INTRAMUSCULAR | Status: DC | PRN
  Administered 2014-04-15 (×2): 1 mg via INTRAVENOUS
  Administered 2014-04-15: 2 mg via INTRAVENOUS
  Administered 2014-04-15: 1 mg via INTRAVENOUS

## 2014-04-15 MED ORDER — ONDANSETRON HCL 4 MG/2ML IJ SOLN
INTRAMUSCULAR | Status: AC
  Filled 2014-04-15: qty 2

## 2014-04-15 MED ORDER — ONDANSETRON HCL 4 MG/2ML IJ SOLN
INTRAMUSCULAR | Status: DC | PRN
  Administered 2014-04-15: 4 mg via INTRAVENOUS

## 2014-04-15 MED ORDER — SODIUM CHLORIDE 0.9 % IV SOLN: INTRAVENOUS | Status: DC

## 2014-04-15 MED ORDER — MIDAZOLAM HCL 5 MG/5ML IJ SOLN
INTRAMUSCULAR | Status: AC
  Filled 2014-04-15: qty 10

## 2014-04-15 NOTE — H&P (Signed)
$'@LOGO'u$ @   Primary Care Physician:  Asencion Noble, MD Primary Gastroenterologist:  Dr. Gala Romney  Pre-Procedure History & Physical: HPI:  [name] is a 63 y.o. female is here for a screening colonoscopy. Positive family history of colon cancer in 2 first degree relatives. No bowel symptoms currently. Negative colonoscopy 5 years ago.  Past Medical History  Diagnosis Date  . Hx of seasonal allergies   . Arthritis   . Abnormal Pap smear   . Postmenopausal HRT (hormone replacement therapy) 11/14/2012    Past Surgical History  Procedure Laterality Date  . Wrist surgery    . Shoulder surgery    . Thumb surger    . Lt knee surgery      Prior to Admission medications   Medication Sig Start Date End Date Taking? Authorizing Provider  Calcium Carbonate-Vitamin D (CALCIUM 600 + D PO) Take 1 tablet by mouth 2 (two) times daily.    Yes Historical Provider, MD  cetirizine (ZYRTEC) 10 MG tablet Take 10 mg by mouth daily as needed for allergies. As needed   Yes Historical Provider, MD  Multiple Vitamins-Minerals (ONE-A-DAY WOMENS 50 PLUS PO) Take 1 tablet by mouth daily.     Yes Historical Provider, MD  NON FORMULARY Take 1 tablet by mouth daily as needed. Aleve Arthritis as needed   Yes Historical Provider, MD  peg 3350 powder (MOVIPREP) 100 G SOLR Take 1 kit (200 g total) by mouth as directed. 03/25/14  Yes Daneil Dolin, MD    Allergies as of 03/25/2014  . (No Known Allergies)    Family History  Problem Relation Age of Onset  . Diabetes Mother   . Colon cancer Mother 23  . Colon cancer Sister 26  . Diabetes Maternal Aunt   . Depression Sister   . Arthritis Sister     History   Social History  . Marital Status: Married    Spouse Name: N/A    Number of Children: N/A  . Years of Education: N/A   Occupational History  . Not on file.   Social History Main Topics  . Smoking status: Never Smoker   . Smokeless tobacco: Never Used  . Alcohol Use: No     Comment: occ  . Drug  Use: No  . Sexual Activity: Yes    Birth Control/ Protection: None, Post-menopausal   Other Topics Concern  . Not on file   Social History Narrative  . No narrative on file    Review of Systems: See HPI, otherwise negative ROS  Physical Exam: BP 138/67  Temp(Src) 98 F (36.7 C) (Oral)  Resp 21  Ht $R'5\' 2"'Rh$  (1.575 m)  Wt 128 lb (58.06 kg)  BMI 23.41 kg/m2  SpO2 96% General:   Alert,  Well-developed, well-nourished, pleasant and cooperative in NAD Head:  Normocephalic and atraumatic. Eyes:  Sclera clear, no icterus.   Conjunctiva pink. Ears:  Normal auditory acuity. Nose:  No deformity, discharge,  or lesions. Mouth:  No deformity or lesions, dentition normal. Neck:  Supple; no masses or thyromegaly. Lungs:  Clear throughout to auscultation.   No wheezes, crackles, or rhonchi. No acute distress. Heart:  Regular rate and rhythm; no murmurs, clicks, rubs,  or gallops. Abdomen:  Soft, nontender and nondistended. No masses, hepatosplenomegaly or hernias noted. Normal bowel sounds, without guarding, and without rebound.   Msk:  Symmetrical without gross deformities. Normal posture. Pulses:  Normal pulses noted. Extremities:  Without clubbing or edema. Neurologic:  Alert and  oriented  x4;  grossly normal neurologically.   Impression/Plan: [name] is now here to undergo a screening colonoscopy. High-risk screening examination. Risks, benefits, limitations, imponderables and alternatives regarding colonoscopy have been reviewed with the patient. Questions have been answered. All parties agreeable.        Notice:  This dictation was prepared with Dragon dictation along with smaller phrase technology. Any transcriptional errors that result from this process are unintentional and may not be corrected upon review.

## 2014-04-15 NOTE — Discharge Instructions (Addendum)
°Colonoscopy °Discharge Instructions ° °Read the instructions outlined below and refer to this sheet in the next few weeks. These discharge instructions provide you with general information on caring for yourself after you leave the hospital. Your doctor may also give you specific instructions. While your treatment has been planned according to the most current medical practices available, unavoidable complications occasionally occur. If you have any problems or questions after discharge, call Dr. Rourk at [phone]. °ACTIVITY °· You may resume your regular activity, but move at a slower pace for the next 24 hours.  °· Take frequent rest periods for the next 24 hours.  °· Walking will help get rid of the air and reduce the bloated feeling in your belly (abdomen).  °· No driving for 24 hours (because of the medicine (anesthesia) used during the test).   °· Do not sign any important legal documents or operate any machinery for 24 hours (because of the anesthesia used during the test).  °NUTRITION °· Drink plenty of fluids.  °· You may resume your normal diet as instructed by your doctor.  °· Begin with a light meal and progress to your normal diet. Heavy or fried foods are harder to digest and may make you feel sick to your stomach (nauseated).  °· Avoid alcoholic beverages for 24 hours or as instructed.  °MEDICATIONS °· You may resume your normal medications unless your doctor tells you otherwise.  °WHAT YOU CAN EXPECT TODAY °· Some feelings of bloating in the abdomen.  °· Passage of more gas than usual.  °· Spotting of blood in your stool or on the toilet paper.  °IF YOU HAD POLYPS REMOVED DURING THE COLONOSCOPY: °· No aspirin products for 7 days or as instructed.  °· No alcohol for 7 days or as instructed.  °· Eat a soft diet for the next 24 hours.  °FINDING OUT THE RESULTS OF YOUR TEST °Not all test results are available during your visit. If your test results are not back during the visit, make an appointment  with your caregiver to find out the results. Do not assume everything is normal if you have not heard from your caregiver or the medical facility. It is important for you to follow up on all of your test results.  °SEEK IMMEDIATE MEDICAL ATTENTION IF: °· You have more than a spotting of blood in your stool.  °· Your belly is swollen (abdominal distention).  °· You are nauseated or vomiting.  °· You have a temperature over 101.  °· You have abdominal pain or discomfort that is severe or gets worse throughout the day.  ° ° °Polyp information provided ° °Further recommendations to follow pending review of pathology report ° °Colon Polyps °Polyps are lumps of extra tissue growing inside the body. Polyps can grow in the large intestine (colon). Most colon polyps are noncancerous (benign). However, some colon polyps can become cancerous over time. Polyps that are larger than a pea may be harmful. To be safe, caregivers remove and test all polyps. °CAUSES  °Polyps form when mutations in the genes cause your cells to grow and divide even though no more tissue is needed. °RISK FACTORS °There are a number of risk factors that can increase your chances of getting colon polyps. They include: °· Being older than 50 years. °· Family history of colon polyps or colon cancer. °· Long-term colon diseases, such as colitis or Crohn disease. °· Being overweight. °· Smoking. °· Being inactive. °· Drinking too much alcohol. °SYMPTOMS  °  Most small polyps do not cause symptoms. If symptoms are present, they may include: °· Blood in the stool. The stool may look dark red or black. °· Constipation or diarrhea that lasts longer than 1 week. °DIAGNOSIS °People often do not know they have polyps until their caregiver finds them during a regular checkup. Your caregiver can use 4 tests to check for polyps: °· Digital rectal exam. The caregiver wears gloves and feels inside the rectum. This test would find polyps only in the rectum. °· Barium  enema. The caregiver puts a liquid called barium into your rectum before taking X-rays of your colon. Barium makes your colon look white. Polyps are dark, so they are easy to see in the X-ray pictures. °· Sigmoidoscopy. A thin, flexible tube (sigmoidoscope) is placed into your rectum. The sigmoidoscope has a light and tiny camera in it. The caregiver uses the sigmoidoscope to look at the last third of your colon. °· Colonoscopy. This test is like sigmoidoscopy, but the caregiver looks at the entire colon. This is the most common method for finding and removing polyps. °TREATMENT  °Any polyps will be removed during a sigmoidoscopy or colonoscopy. The polyps are then tested for cancer. °PREVENTION  °To help lower your risk of getting more colon polyps: °· Eat plenty of fruits and vegetables. Avoid eating fatty foods. °· Do not smoke. °· Avoid drinking alcohol. °· Exercise every day. °· Lose weight if recommended by your caregiver. °· Eat plenty of calcium and folate. Foods that are rich in calcium include milk, cheese, and broccoli. Foods that are rich in folate include chickpeas, kidney beans, and spinach. °HOME CARE INSTRUCTIONS °Keep all follow-up appointments as directed by your caregiver. You may need periodic exams to check for polyps. °SEEK MEDICAL CARE IF: °You notice bleeding during a bowel movement. °Document Released: 05/03/2004 Document Revised: 10/30/2011 Document Reviewed: 10/17/2011 °ExitCare® Patient Information ©2015 [employer]. This information is not intended to replace advice given to you by your health care provider. Make sure you discuss any questions you have with your health care provider. ° °

## 2014-04-15 NOTE — Op Note (Signed)
[facility] [address] [place], [address]   COLONOSCOPY PROCEDURE REPORT  PATIENT: [name], [name]  MR#:         [id number] BIRTHDATE: 06/25/1951 , 63  yrs. old GENDER: Female ENDOSCOPIST: R.  Garfield Cornea, MD FACP FACG REFERRED BY:  Asencion Noble, M.D. PROCEDURE DATE:  04/15/2014 PROCEDURE:     Colonoscopy with biopsy, polyp ablation and snare polypectomy  INDICATIONS: High-risk screening examination  INFORMED CONSENT:  The risks, benefits, alternatives and imponderables including but not limited to bleeding, perforation as well as the possibility of a missed lesion have been reviewed.  The potential for biopsy, lesion removal, etc. have also been discussed.  Questions have been answered.  All parties agreeable. Please see the history and physical in the medical record for more information.  MEDICATIONS: Versed 5 mg IV and Demerol 75 mg IV in divided doses. Zofran 4 mg IV.  DESCRIPTION OF PROCEDURE:  After a digital rectal exam was performed, the EC-3890Li ([id number])  colonoscope was advanced from the anus through the rectum and colon to the area of the cecum, ileocecal valve and appendiceal orifice.  The cecum was deeply intubated.  These structures were well-seen and photographed for the record.  From the level of the cecum and ileocecal valve, the scope was slowly and cautiously withdrawn.  The mucosal surfaces were carefully surveyed utilizing scope tip deflection to facilitate fold flattening as needed.  The scope was pulled down into the rectum where a thorough examination including retroflexion was performed.    FINDINGS:  Adequate preparation. Normal rectum. Redundant, tortuous colon requiring changing the patient's position and external abdominal pressure to reach the cecum. (1) diminutive polyp in the base the cecum; (1) 6 mm polyp in the mid ascending segment with  a skinny area of polypoid mucosa adjacent to it. Otherwise, the remainder of  the colonic mucosa appeared normal.  THERAPEUTIC / DIAGNOSTIC MANEUVERS PERFORMED:  the cecal polyp was cold biopsied/removed. The ascending colon polyp was hot snare removed. The skinny area of polypoid mucosa adjacent to the snare polyp was ablated with the tip of the hot snare loop.  COMPLICATIONS: none  CECAL WITHDRAWAL TIME:  16 minutes  IMPRESSION:  Multiple colonic polyps - treated and/or removed as described above.  RECOMMENDATIONS:   Followup on pathology.   _______________________________ eSigned:  R. Garfield Cornea, MD FACP [facility] 04/15/2014 10:34 AM   CC:

## 2014-04-17 ENCOUNTER — Encounter (HOSPITAL_COMMUNITY): Payer: Self-pay | Admitting: Internal Medicine

## 2014-04-17 ENCOUNTER — Encounter: Payer: Self-pay | Admitting: Internal Medicine

## 2014-05-05 DIAGNOSIS — H2510 Age-related nuclear cataract, unspecified eye: Secondary | ICD-10-CM | POA: Diagnosis not present

## 2014-05-05 DIAGNOSIS — H43399 Other vitreous opacities, unspecified eye: Secondary | ICD-10-CM | POA: Diagnosis not present

## 2014-06-11 DIAGNOSIS — Z23 Encounter for immunization: Secondary | ICD-10-CM | POA: Diagnosis not present

## 2014-06-22 ENCOUNTER — Encounter (HOSPITAL_COMMUNITY): Payer: Self-pay | Admitting: Internal Medicine

## 2014-07-09 ENCOUNTER — Other Ambulatory Visit: Payer: Self-pay | Admitting: Obstetrics & Gynecology

## 2014-07-09 DIAGNOSIS — Z1231 Encounter for screening mammogram for malignant neoplasm of breast: Secondary | ICD-10-CM

## 2014-08-03 ENCOUNTER — Ambulatory Visit (HOSPITAL_COMMUNITY)
Admission: RE | Admit: 2014-08-03 | Discharge: 2014-08-03 | Disposition: A | Discharge status: Home / Self Care | Payer: Medicare Other | Source: Ambulatory Visit | Attending: Obstetrics & Gynecology | Admitting: Obstetrics & Gynecology

## 2014-08-03 DIAGNOSIS — Z1231 Encounter for screening mammogram for malignant neoplasm of breast: Secondary | ICD-10-CM | POA: Insufficient documentation

## 2014-11-26 ENCOUNTER — Encounter: Payer: Self-pay | Admitting: Adult Health

## 2014-11-26 ENCOUNTER — Ambulatory Visit (INDEPENDENT_AMBULATORY_CARE_PROVIDER_SITE_OTHER): Payer: Medicare Other | Admitting: Adult Health

## 2014-11-26 VITALS — BP 124/86 | HR 72 | Ht 62.0 in | Wt 133.0 lb

## 2014-11-26 DIAGNOSIS — Z01419 Encounter for gynecological examination (general) (routine) without abnormal findings: Secondary | ICD-10-CM | POA: Diagnosis not present

## 2014-11-26 DIAGNOSIS — Z1212 Encounter for screening for malignant neoplasm of rectum: Secondary | ICD-10-CM | POA: Diagnosis not present

## 2014-11-26 LAB — HEMOCCULT GUIAC POC 1CARD (OFFICE): Fecal Occult Blood, POC: NEGATIVE

## 2014-11-26 NOTE — Progress Notes (Signed)
Patient ID: [name], female   DOB: [birth year], 64 y.o.   MRN: [id number] History of Present Illness: [name] is a 64 year old white female, married in for a well woman gyn exam.She had a normal pap in 2015.Her mammogram was normal in 07/2014, had negative HPV 2014.Marland KitchenAnd she had a colonoscopy in 2015 by Dr Gala Romney.Her PCP is Dr Willey Blade.   Current Medications, Allergies, Past Medical History, Past Surgical History, Family History and Social History were reviewed in [employer] record.     Review of Systems: Patient denies any headaches, hearing loss, fatigue, blurred vision, shortness of breath, chest pain, abdominal pain, problems with bowel movements, urination, or intercourse. No  mood swings.Has pain in knees, has arthritis.    Physical Exam:BP 124/86 mmHg  Pulse 72  Ht 5\' 2"  (1.575 m)  Wt 133 lb (60.328 kg)  BMI 24.32 kg/m2 General:  Well developed, well nourished, no acute distress Skin:  Warm and dry Neck:  Midline trachea, normal thyroid, good ROM, no lymphadenopathy, no carotid bruits heard. Lungs; Clear to auscultation bilaterally Breast:  No dominant palpable mass, retraction, or nipple discharge Cardiovascular: Regular rate and rhythm Abdomen:  Soft, non tender, no hepatosplenomegaly Pelvic:  External genitalia is normal in appearance, no lesions.  The vagina is pale with loss of moisture and rugae Urethra has no lesions or masses. The cervix is smooth.  Uterus is felt to be normal size, shape, and contour.  No adnexal masses or tenderness noted.Bladder is non tender, no masses felt. Rectal: Good sphincter tone, no polyps, or hemorrhoids felt.  Hemoccult negative. Extremities/musculoskeletal:  No swelling or varicosities noted, no clubbing or cyanosis Psych:  No mood changes, alert and cooperative,seems happy   Impression: Well woman gyn exam no pap    Plan: Pap and physical in 2 years Mammogram yearly Colonoscopy 2020 Labs with PCP

## 2014-11-26 NOTE — Patient Instructions (Signed)
Labs with PCP Mammogram yearly  colonoscopy 2020 Pap and physical in 2 years

## 2015-03-01 DIAGNOSIS — Z79899 Other long term (current) drug therapy: Secondary | ICD-10-CM | POA: Diagnosis not present

## 2015-03-01 DIAGNOSIS — M1990 Unspecified osteoarthritis, unspecified site: Secondary | ICD-10-CM | POA: Diagnosis not present

## 2015-03-08 ENCOUNTER — Other Ambulatory Visit (HOSPITAL_COMMUNITY): Payer: Self-pay | Admitting: Internal Medicine

## 2015-03-08 DIAGNOSIS — Z6824 Body mass index (BMI) 24.0-24.9, adult: Secondary | ICD-10-CM | POA: Diagnosis not present

## 2015-03-08 DIAGNOSIS — Z23 Encounter for immunization: Secondary | ICD-10-CM | POA: Diagnosis not present

## 2015-03-08 DIAGNOSIS — M1990 Unspecified osteoarthritis, unspecified site: Secondary | ICD-10-CM | POA: Diagnosis not present

## 2015-03-08 DIAGNOSIS — M810 Age-related osteoporosis without current pathological fracture: Secondary | ICD-10-CM

## 2015-03-08 DIAGNOSIS — K635 Polyp of colon: Secondary | ICD-10-CM | POA: Diagnosis not present

## 2015-03-08 DIAGNOSIS — E785 Hyperlipidemia, unspecified: Secondary | ICD-10-CM | POA: Diagnosis not present

## 2015-03-16 ENCOUNTER — Ambulatory Visit (HOSPITAL_COMMUNITY)
Admission: RE | Admit: 2015-03-16 | Discharge: 2015-03-16 | Disposition: A | Discharge status: Home / Self Care | Payer: Medicare Other | Source: Ambulatory Visit | Attending: Internal Medicine | Admitting: Internal Medicine

## 2015-03-16 DIAGNOSIS — Z780 Asymptomatic menopausal state: Secondary | ICD-10-CM | POA: Diagnosis present

## 2015-03-16 DIAGNOSIS — M8580 Other specified disorders of bone density and structure, unspecified site: Secondary | ICD-10-CM | POA: Diagnosis not present

## 2015-03-16 DIAGNOSIS — M810 Age-related osteoporosis without current pathological fracture: Secondary | ICD-10-CM

## 2015-03-16 DIAGNOSIS — M85851 Other specified disorders of bone density and structure, right thigh: Secondary | ICD-10-CM | POA: Diagnosis not present

## 2015-03-17 ENCOUNTER — Other Ambulatory Visit (HOSPITAL_COMMUNITY): Payer: 59

## 2015-05-31 DIAGNOSIS — Z23 Encounter for immunization: Secondary | ICD-10-CM | POA: Diagnosis not present

## 2015-07-27 ENCOUNTER — Other Ambulatory Visit (HOSPITAL_COMMUNITY): Payer: Self-pay | Admitting: Internal Medicine

## 2015-07-27 DIAGNOSIS — Z1231 Encounter for screening mammogram for malignant neoplasm of breast: Secondary | ICD-10-CM

## 2015-08-06 ENCOUNTER — Ambulatory Visit (HOSPITAL_COMMUNITY)
Admission: RE | Admit: 2015-08-06 | Discharge: 2015-08-06 | Disposition: A | Discharge status: Home / Self Care | Payer: Medicare Other | Source: Ambulatory Visit | Attending: Internal Medicine | Admitting: Internal Medicine

## 2015-08-06 DIAGNOSIS — Z1231 Encounter for screening mammogram for malignant neoplasm of breast: Secondary | ICD-10-CM | POA: Insufficient documentation

## 2016-03-13 DIAGNOSIS — Z79899 Other long term (current) drug therapy: Secondary | ICD-10-CM | POA: Diagnosis not present

## 2016-03-13 DIAGNOSIS — M1990 Unspecified osteoarthritis, unspecified site: Secondary | ICD-10-CM | POA: Diagnosis not present

## 2016-03-20 DIAGNOSIS — Z23 Encounter for immunization: Secondary | ICD-10-CM | POA: Diagnosis not present

## 2016-03-20 DIAGNOSIS — M1990 Unspecified osteoarthritis, unspecified site: Secondary | ICD-10-CM | POA: Diagnosis not present

## 2016-03-20 DIAGNOSIS — E785 Hyperlipidemia, unspecified: Secondary | ICD-10-CM | POA: Diagnosis not present

## 2016-03-20 DIAGNOSIS — R7301 Impaired fasting glucose: Secondary | ICD-10-CM | POA: Diagnosis not present

## 2016-04-04 DIAGNOSIS — H2513 Age-related nuclear cataract, bilateral: Secondary | ICD-10-CM | POA: Diagnosis not present

## 2016-05-22 DIAGNOSIS — Z23 Encounter for immunization: Secondary | ICD-10-CM | POA: Diagnosis not present

## 2016-07-12 ENCOUNTER — Other Ambulatory Visit (HOSPITAL_COMMUNITY): Payer: Self-pay | Admitting: Internal Medicine

## 2016-07-12 DIAGNOSIS — Z1231 Encounter for screening mammogram for malignant neoplasm of breast: Secondary | ICD-10-CM

## 2016-08-07 ENCOUNTER — Ambulatory Visit (HOSPITAL_COMMUNITY)
Admission: RE | Admit: 2016-08-07 | Discharge: 2016-08-07 | Disposition: A | Discharge status: Home / Self Care | Payer: Medicare Other | Source: Ambulatory Visit | Attending: Internal Medicine | Admitting: Internal Medicine

## 2016-08-07 DIAGNOSIS — Z1231 Encounter for screening mammogram for malignant neoplasm of breast: Secondary | ICD-10-CM | POA: Diagnosis not present

## 2016-08-09 ENCOUNTER — Ambulatory Visit (HOSPITAL_COMMUNITY): Payer: Medicare Other

## 2016-11-27 ENCOUNTER — Encounter: Payer: Self-pay | Admitting: Adult Health

## 2016-11-27 ENCOUNTER — Other Ambulatory Visit (HOSPITAL_COMMUNITY)
Admission: RE | Admit: 2016-11-27 | Discharge: 2016-11-27 | Disposition: A | Discharge status: Home / Self Care | Payer: Medicare Other | Source: Ambulatory Visit | Attending: Adult Health | Admitting: Adult Health

## 2016-11-27 ENCOUNTER — Encounter (INDEPENDENT_AMBULATORY_CARE_PROVIDER_SITE_OTHER): Payer: Self-pay

## 2016-11-27 ENCOUNTER — Ambulatory Visit (INDEPENDENT_AMBULATORY_CARE_PROVIDER_SITE_OTHER): Payer: Medicare Other | Admitting: Adult Health

## 2016-11-27 VITALS — BP 152/60 | HR 72 | Ht 62.5 in | Wt 132.0 lb

## 2016-11-27 DIAGNOSIS — N816 Rectocele: Secondary | ICD-10-CM

## 2016-11-27 DIAGNOSIS — Z01419 Encounter for gynecological examination (general) (routine) without abnormal findings: Secondary | ICD-10-CM | POA: Diagnosis not present

## 2016-11-27 DIAGNOSIS — Z1211 Encounter for screening for malignant neoplasm of colon: Secondary | ICD-10-CM

## 2016-11-27 DIAGNOSIS — Z1212 Encounter for screening for malignant neoplasm of rectum: Secondary | ICD-10-CM

## 2016-11-27 DIAGNOSIS — N814 Uterovaginal prolapse, unspecified: Secondary | ICD-10-CM

## 2016-11-27 LAB — HEMOCCULT GUIAC POC 1CARD (OFFICE): FECAL OCCULT BLD: NEGATIVE

## 2016-11-27 NOTE — Progress Notes (Signed)
Patient ID: [name], female   DOB: Jan 23, 1951, 66 y.o.   MRN: [id number] History of Present Illness: [name] is a 66 year old white female, married in for well woman gyn exam and pap.No complaints. PCP is Dr Willey Blade.   Current Medications, Allergies, Past Medical History, Past Surgical History, Family History and Social History were reviewed in [employer] record.     Review of Systems: Patient denies any headaches, hearing loss, fatigue, blurred vision, shortness of breath, chest pain, abdominal pain, problems with bowel movements, urination, or intercourse. No joint pain or mood swings.She is active and is doing well.    Physical Exam:BP (!) 152/60 (BP Location: Right Arm, Patient Position: Sitting, Cuff Size: Normal)   Pulse 72   Ht 5' 2.5" (1.588 m)   Wt 132 lb (59.9 kg)   BMI 23.76 kg/m  General:  Well developed, well nourished, no acute distress Skin:  Warm and dry,tan Neck:  Midline trachea, normal thyroid, good ROM, no lymphadenopathy,no carotid bruits heard Lungs; Clear to auscultation bilaterally Breast:  No dominant palpable mass, retraction, or nipple discharge Cardiovascular: Regular rate and rhythm Abdomen:  Soft, non tender, no hepatosplenomegaly Pelvic:  External genitalia is normal in appearance, no lesions.  The vagina is normal in appearance. Urethra has no lesions or masses. The cervix is bulbous.  Uterus is felt to be normal size, shape, and contour.  No adnexal masses or tenderness noted.Bladder is non tender, no masses felt.+cystocele and uterine descensus  Rectal: Good sphincter tone, no polyps, or hemorrhoids felt.  Hemoccult negative.+rectocele Extremities/musculoskeletal:  No swelling or varicosities noted, no clubbing or cyanosis Psych:  No mood changes, alert and cooperative,seems happy PHQ 2 score 0.  Impression: 1. Pap smear, as part of routine gynecological examination   2. Screening for colorectal cancer   3. Cystocele  with uterine descensus   4. Rectocele       Plan: Physical in 2 years Mammogram yearly Colonoscopy 2025 Labs with PCP

## 2016-11-27 NOTE — Patient Instructions (Signed)
Physical in 2 years 

## 2016-11-28 LAB — CYTOLOGY - PAP
Diagnosis: NEGATIVE
HPV: NOT DETECTED

## 2017-01-03 DIAGNOSIS — L259 Unspecified contact dermatitis, unspecified cause: Secondary | ICD-10-CM | POA: Diagnosis not present

## 2017-01-03 DIAGNOSIS — Z6824 Body mass index (BMI) 24.0-24.9, adult: Secondary | ICD-10-CM | POA: Diagnosis not present

## 2017-02-19 ENCOUNTER — Encounter: Payer: Self-pay | Admitting: Internal Medicine

## 2017-03-21 DIAGNOSIS — E785 Hyperlipidemia, unspecified: Secondary | ICD-10-CM | POA: Diagnosis not present

## 2017-03-21 DIAGNOSIS — R7301 Impaired fasting glucose: Secondary | ICD-10-CM | POA: Diagnosis not present

## 2017-03-21 DIAGNOSIS — Z79899 Other long term (current) drug therapy: Secondary | ICD-10-CM | POA: Diagnosis not present

## 2017-03-21 DIAGNOSIS — M1990 Unspecified osteoarthritis, unspecified site: Secondary | ICD-10-CM | POA: Diagnosis not present

## 2017-03-29 DIAGNOSIS — F411 Generalized anxiety disorder: Secondary | ICD-10-CM | POA: Diagnosis not present

## 2017-03-29 DIAGNOSIS — R7301 Impaired fasting glucose: Secondary | ICD-10-CM | POA: Diagnosis not present

## 2017-03-29 DIAGNOSIS — Z6824 Body mass index (BMI) 24.0-24.9, adult: Secondary | ICD-10-CM | POA: Diagnosis not present

## 2017-03-29 DIAGNOSIS — E785 Hyperlipidemia, unspecified: Secondary | ICD-10-CM | POA: Diagnosis not present

## 2017-04-10 DIAGNOSIS — H2513 Age-related nuclear cataract, bilateral: Secondary | ICD-10-CM | POA: Diagnosis not present

## 2017-04-19 ENCOUNTER — Telehealth: Payer: Self-pay | Admitting: Internal Medicine

## 2017-04-19 NOTE — Telephone Encounter (Signed)
PATIENT DUE FOR 3 YR TCS, MEDICARE AND NO GI ISSUES

## 2017-04-26 NOTE — Telephone Encounter (Signed)
LMOM to call.

## 2017-04-30 ENCOUNTER — Telehealth: Payer: Self-pay

## 2017-04-30 NOTE — Telephone Encounter (Signed)
See separate triage.  

## 2017-04-30 NOTE — Telephone Encounter (Signed)
Opened in error

## 2017-05-03 ENCOUNTER — Other Ambulatory Visit: Payer: Self-pay

## 2017-05-03 DIAGNOSIS — Z800 Family history of malignant neoplasm of digestive organs: Secondary | ICD-10-CM

## 2017-05-03 DIAGNOSIS — Z86010 Personal history of colonic polyps: Secondary | ICD-10-CM

## 2017-05-03 MED ORDER — PEG 3350-KCL-NA BICARB-NACL 420 G PO SOLR: 4000.0000 mL | ORAL | 0 refills | Status: DC

## 2017-05-03 NOTE — Telephone Encounter (Signed)
Rx sent to the pharmacy and instructions mailed to pt.  

## 2017-05-03 NOTE — Telephone Encounter (Signed)
See separate triage.  

## 2017-05-03 NOTE — Telephone Encounter (Signed)
Gastroenterology Pre-Procedure Review  Request Date:04/30/2017 Requesting Physician: RECALL  PATIENT REVIEW QUESTIONS: The patient responded to the following health history questions as indicated:   Pt's last colonoscopy was 04/15/2014 by Dr. Gala Romney Hx of adenomatous polyps and family hx of colon cancer  Next recommended in 3 years  1. Diabetes Melitis: no 2. Joint replacements in the past 12 months: no 3. Major health problems in the past 3 months: no 4. Has an artificial valve or MVP: no 5. Has a defibrillator: no 6. Has been advised in past to take antibiotics in advance of a procedure like teeth cleaning: no 7. Family history of colon cancer: YES  Mother in 33's and sister in 30's  8. Alcohol Use: no 9. History of sleep apnea: no  10. History of coronary artery or other vascular stents placed within the last 12 months: no 11. History of any prior anesthesia complications: no    MEDICATIONS & ALLERGIES:    Patient reports the following regarding taking any blood thinners:   Plavix? no Aspirin? no Coumadin? no Brilinta? no Xarelto? no Eliquis? no Pradaxa? no Savaysa? no Effient? no  Patient confirms/reports the following medications:  Current Outpatient Prescriptions  Medication Sig Dispense Refill  . acetaminophen (TYLENOL) 500 MG tablet Take 500 mg by mouth every 6 (six) hours as needed. Only as needed    . Calcium Carbonate-Vitamin D (CALCIUM 600 + D PO) Take 1 tablet by mouth 2 (two) times daily.     . Multiple Vitamins-Minerals (ONE-A-DAY WOMENS 50 PLUS PO) Take 1 tablet by mouth daily.      . cetirizine (ZYRTEC) 10 MG tablet Take 10 mg by mouth daily as needed for allergies. As needed    . NON FORMULARY Take 1 tablet by mouth daily as needed. Aleve Arthritis as needed     No current facility-administered medications for this visit.     Patient confirms/reports the following allergies:  No Known Allergies  No orders of the defined types were placed in this  encounter.   AUTHORIZATION INFORMATION Primary Insurance:   ID #:   Group #:  Pre-Cert / Auth required:  Pre-Cert / Auth #:   Secondary Insurance:   ID #: Group #:  Pre-Cert / Auth required: Pre-Cert / Auth #:   SCHEDULE INFORMATION: Procedure has been scheduled as follows:  Date: 06/06/2017                 Time:  8:30 AM Location: [facility] Short Stay  This Gastroenterology Pre-Precedure Review Form is being routed to the following provider(s): R. Garfield Cornea, MD

## 2017-05-03 NOTE — Telephone Encounter (Signed)
Appropriate.

## 2017-05-03 NOTE — Addendum Note (Signed)
Addended by: Everardo All on: 05/03/2017 01:46 PM   Modules accepted: Orders

## 2017-05-18 DIAGNOSIS — Z23 Encounter for immunization: Secondary | ICD-10-CM | POA: Diagnosis not present

## 2017-06-06 ENCOUNTER — Encounter (HOSPITAL_COMMUNITY): Payer: Self-pay | Admitting: *Deleted

## 2017-06-06 ENCOUNTER — Ambulatory Visit (HOSPITAL_COMMUNITY)
Admission: RE | Admit: 2017-06-06 | Discharge: 2017-06-06 | Disposition: A | Discharge status: Home / Self Care | Payer: Medicare Other | Source: Ambulatory Visit | Attending: Internal Medicine | Admitting: Internal Medicine

## 2017-06-06 ENCOUNTER — Encounter (HOSPITAL_COMMUNITY)
Admission: RE | Disposition: A | Discharge status: Home / Self Care | Payer: Self-pay | Source: Ambulatory Visit | Attending: Internal Medicine

## 2017-06-06 DIAGNOSIS — Z1211 Encounter for screening for malignant neoplasm of colon: Secondary | ICD-10-CM | POA: Insufficient documentation

## 2017-06-06 DIAGNOSIS — Z86010 Personal history of colonic polyps: Secondary | ICD-10-CM | POA: Insufficient documentation

## 2017-06-06 DIAGNOSIS — Z800 Family history of malignant neoplasm of digestive organs: Secondary | ICD-10-CM

## 2017-06-06 DIAGNOSIS — D122 Benign neoplasm of ascending colon: Secondary | ICD-10-CM | POA: Diagnosis not present

## 2017-06-06 HISTORY — PX: POLYPECTOMY: SHX5525

## 2017-06-06 HISTORY — PX: COLONOSCOPY: SHX5424

## 2017-06-06 HISTORY — DX: Polyp of colon: K63.5

## 2017-06-06 SURGERY — COLONOSCOPY
Anesthesia: Moderate Sedation

## 2017-06-06 MED ORDER — ONDANSETRON HCL 4 MG/2ML IJ SOLN
INTRAMUSCULAR | Status: DC | PRN
  Administered 2017-06-06: 4 mg via INTRAVENOUS

## 2017-06-06 MED ORDER — SODIUM CHLORIDE 0.9 % IV SOLN
INTRAVENOUS | Status: DC
  Administered 2017-06-06: 08:00:00 via INTRAVENOUS

## 2017-06-06 MED ORDER — MIDAZOLAM HCL 5 MG/5ML IJ SOLN
INTRAMUSCULAR | Status: DC | PRN
  Administered 2017-06-06: 1 mg via INTRAVENOUS
  Administered 2017-06-06 (×2): 2 mg via INTRAVENOUS

## 2017-06-06 MED ORDER — MEPERIDINE HCL 100 MG/ML IJ SOLN
INTRAMUSCULAR | Status: DC | PRN
  Administered 2017-06-06 (×2): 50 mg via INTRAVENOUS
  Administered 2017-06-06: 25 mg via INTRAVENOUS

## 2017-06-06 MED ORDER — STERILE WATER FOR IRRIGATION IR SOLN
Status: DC | PRN
  Administered 2017-06-06: 2.5 mL

## 2017-06-06 MED ORDER — MEPERIDINE HCL 100 MG/ML IJ SOLN
INTRAMUSCULAR | Status: AC
  Filled 2017-06-06: qty 2

## 2017-06-06 MED ORDER — ONDANSETRON HCL 4 MG/2ML IJ SOLN
INTRAMUSCULAR | Status: AC
  Filled 2017-06-06: qty 2

## 2017-06-06 MED ORDER — MIDAZOLAM HCL 5 MG/5ML IJ SOLN
INTRAMUSCULAR | Status: AC
  Filled 2017-06-06: qty 10

## 2017-06-06 NOTE — Discharge Instructions (Addendum)
Colon Polyps °Polyps are tissue growths inside the body. Polyps can grow in many places, including the large intestine (colon). A polyp may be a round bump or a mushroom-shaped growth. You could have one polyp or several. °Most colon polyps are noncancerous (benign). However, some colon polyps can become cancerous over time. °What are the causes? °The exact cause of colon polyps is not known. °What increases the risk? °This condition is more likely to develop in people who: °· Have a family history of colon cancer or colon polyps. °· Are older than 50 or older than 45 if they are African American. °· Have inflammatory bowel disease, such as ulcerative colitis or Crohn disease. °· Are overweight. °· Smoke cigarettes. °· Do not get enough exercise. °· Drink too much alcohol. °· Eat a diet that is: °? High in fat and red meat. °? Low in fiber. °· Had childhood cancer that was treated with abdominal radiation. ° °What are the signs or symptoms? °Most polyps do not cause symptoms. If you have symptoms, they may include: °· Blood coming from your rectum when having a bowel movement. °· Blood in your stool. The stool may look dark red or black. °· A change in bowel habits, such as constipation or diarrhea. ° °How is this diagnosed? °This condition is diagnosed with a colonoscopy. This is a procedure that uses a lighted, flexible scope to look at the inside of your colon. °How is this treated? °Treatment for this condition involves removing any polyps that are found. Those polyps will then be tested for cancer. If cancer is found, your health care provider will talk to you about options for colon cancer treatment. °Follow these instructions at home: °Diet °· Eat plenty of fiber, such as fruits, vegetables, and whole grains. °· Eat foods that are high in calcium and vitamin D, such as milk, cheese, yogurt, eggs, liver, fish, and broccoli. °· Limit foods high in fat, red meats, and processed meats, such as hot dogs, sausage,  bacon, and lunch meats. °· Maintain a healthy weight, or lose weight if recommended by your health care provider. °General instructions °· Do not smoke cigarettes. °· Do not drink alcohol excessively. °· Keep all follow-up visits as told by your health care provider. This is important. This includes keeping regularly scheduled colonoscopies. Talk to your health care provider about when you need a colonoscopy. °· Exercise every day or as told by your health care provider. °Contact a health care provider if: °· You have new or worsening bleeding during a bowel movement. °· You have new or increased blood in your stool. °· You have a change in bowel habits. °· You unexpectedly lose weight. °This information is not intended to replace advice given to you by your health care provider. Make sure you discuss any questions you have with your health care provider. °Document Released: 05/03/2004 Document Revised: 01/13/2016 Document Reviewed: 06/28/2015 °Elsevier Interactive Patient Education © 2018 [employer] ° °Colonoscopy °Discharge Instructions ° °Read the instructions outlined below and refer to this sheet in the next few weeks. These discharge instructions provide you with general information on caring for yourself after you leave the hospital. Your doctor may also give you specific instructions. While your treatment has been planned according to the most current medical practices available, unavoidable complications occasionally occur. If you have any problems or questions after discharge, call Dr. Rourk at [phone]. °ACTIVITY °· You may resume your regular activity, but move at a slower pace for the next 24   hours.  °· Take frequent rest periods for the next 24 hours.  °· Walking will help get rid of the air and reduce the bloated feeling in your belly (abdomen).  °· No driving for 24 hours (because of the medicine (anesthesia) used during the test).   °· Do not sign any important legal documents or operate any  machinery for 24 hours (because of the anesthesia used during the test).  °NUTRITION °· Drink plenty of fluids.  °· You may resume your normal diet as instructed by your doctor.  °· Begin with a light meal and progress to your normal diet. Heavy or fried foods are harder to digest and may make you feel sick to your stomach (nauseated).  °· Avoid alcoholic beverages for 24 hours or as instructed.  °MEDICATIONS °· You may resume your normal medications unless your doctor tells you otherwise.  °WHAT YOU CAN EXPECT TODAY °· Some feelings of bloating in the abdomen.  °· Passage of more gas than usual.  °· Spotting of blood in your stool or on the toilet paper.  °IF YOU HAD POLYPS REMOVED DURING THE COLONOSCOPY: °· No aspirin products for 7 days or as instructed.  °· No alcohol for 7 days or as instructed.  °· Eat a soft diet for the next 24 hours.  °FINDING OUT THE RESULTS OF YOUR TEST °Not all test results are available during your visit. If your test results are not back during the visit, make an appointment with your caregiver to find out the results. Do not assume everything is normal if you have not heard from your caregiver or the medical facility. It is important for you to follow up on all of your test results.  °SEEK IMMEDIATE MEDICAL ATTENTION IF: °· You have more than a spotting of blood in your stool.  °· Your belly is swollen (abdominal distention).  °· You are nauseated or vomiting.  °· You have a temperature over 101.  °· You have abdominal pain or discomfort that is severe or gets worse throughout the day.  ° ° ° °Colon polyp information provided ° °Further recommendations to follow pending review of pathology report °

## 2017-06-06 NOTE — Op Note (Signed)
[facility] Patient Name: [name] Procedure Date: 06/06/2017 8:31 AM MRN: [id number] Date of Birth: 04/06/1951 Attending MD: Norvel Richards , MD CSN: [id number] Age: 66 Admit Type: Outpatient Procedure:                Colonoscopy Indications:              High risk colon cancer surveillance: Personal                            history of colonic polyps Providers:                Norvel Richards, MD, Selena Lesser, Aram Candela Referring MD:              Medicines:                Midazolam 5 mg IV, Meperidine 125 mg IV,                            Ondansetron 4 mg IV Complications:            No immediate complications. Estimated Blood Loss:     Estimated blood loss was minimal. Procedure:                Pre-Anesthesia Assessment:                           - Prior to the procedure, a History and Physical                            was performed, and patient medications and                            allergies were reviewed. The patient's tolerance of                            previous anesthesia was also reviewed. The risks                            and benefits of the procedure and the sedation                            options and risks were discussed with the patient.                            All questions were answered, and informed consent                            was obtained. Prior Anticoagulants: The patient has                            taken no previous anticoagulant or antiplatelet  agents. ASA Grade Assessment: II - A patient with                            mild systemic disease. After reviewing the risks                            and benefits, the patient was deemed in                            satisfactory condition to undergo the procedure.                           After obtaining informed consent, the colonoscope                            was passed under direct vision. Throughout the                             procedure, the patient's blood pressure, pulse, and                            oxygen saturations were monitored continuously. The                            EC-[id number] ([id number]) scope was introduced through                            the anus and advanced to the the cecum, identified                            by appendiceal orifice and ileocecal valve. The                            colonoscopy was performed without difficulty. The                            patient tolerated the procedure well. The quality                            of the bowel preparation was adequate. The entire                            colon was well visualized. The ileocecal valve,                            appendiceal orifice, and rectum were photographed. Scope In: 9:07:36 AM Scope Out: 9:29:49 AM Scope Withdrawal Time: 0 hours 16 minutes 2 seconds  Total Procedure Duration: 0 hours 22 minutes 13 seconds  Findings:      The perianal and digital rectal examinations were normal.      A 10 mm polyp was found in the ascending colon. The polyp was sessile.       The polyp was removed with a hot snare. Resection and retrieval were  complete. Estimated blood loss: none.      A 5 mm polyp was found in the ascending colon. The polyp was sessile.       The polyp was removed with a cold snare. Resection and retrieval were       complete. Estimated blood loss was minimal. Estimated blood loss was       minimal.      The exam was otherwise without abnormality on direct and retroflexion       views. Impression:               - One 10 mm polyp in the ascending colon, removed                            with a hot snare. Resected and retrieved.                           - One 5 mm polyp in the ascending colon, removed                            with a cold snare. Resected and retrieved.                           - The examination was otherwise normal on direct                            and  retroflexion views. Moderate Sedation:      Moderate (conscious) sedation was administered by the endoscopy nurse       and supervised by the endoscopist. The following parameters were       monitored: oxygen saturation, heart rate, blood pressure, respiratory       rate, EKG, adequacy of pulmonary ventilation, and response to care.       Total physician intraservice time was 27 minutes. Recommendation:           - Patient has a contact number available for                            emergencies. The signs and symptoms of potential                            delayed complications were discussed with the                            patient. Return to normal activities tomorrow.                            Written discharge instructions were provided to the                            patient.                           - Resume previous diet.                           - Continue present medications.                           -  Await pathology results.                           - Repeat colonoscopy after studies are complete for                            surveillance based on pathology results.                           - Return to GI clinic (date not yet determined). Procedure Code(s):        --- Professional ---                           [phone], Colonoscopy, flexible; with removal of                            tumor(s), polyp(s), or other lesion(s) by snare                            technique                           99152, Moderate sedation services provided by the                            same physician or other qualified health care                            professional performing the diagnostic or                            therapeutic service that the sedation supports,                            requiring the presence of an independent trained                            observer to assist in the monitoring of the                            patient's level of consciousness and  physiological                            status; initial 15 minutes of intraservice time,                            patient age 23 years or older                           [phone], Moderate sedation services; each additional                            15 minutes intraservice time Diagnosis Code(s):        --- Professional ---  Z86.010, Personal history of colonic polyps                           D12.2, Benign neoplasm of ascending colon CPT copyright 2016 American Medical Association. All rights reserved. The codes documented in this report are preliminary and upon coder review may  be revised to meet current compliance requirements. Cristopher Estimable. Airanna Partin, MD Norvel Richards, MD 06/06/2017 9:38:21 AM This report has been signed electronically. Number of Addenda: 0

## 2017-06-06 NOTE — H&P (Signed)
@LOGO @   Primary Care Physician:  Asencion Noble, MD Primary Gastroenterologist:  Dr. Gala Romney  Pre-Procedure History & Physical: HPI:  [name] is a 66 y.o. female here for surveillance colonoscopy. History of colonic polyps-adenomas and positive family history colon cancer in 2 first-degree relatives.  No GI symptoms currently.  Past Medical History:  Diagnosis Date  . Abnormal Pap smear   . Arthritis   . Colon polyps   . Hx of seasonal allergies   . Postmenopausal HRT (hormone replacement therapy) 11/14/2012  . Vaginal Pap smear, abnormal     Past Surgical History:  Procedure Laterality Date  . COLONOSCOPY N/A 04/15/2014   Procedure: COLONOSCOPY;  Surgeon: Daneil Dolin, MD;  Location: [facility];  Service: Endoscopy;  Laterality: N/A;  9:30 AM  . LT KNEE SURGERY    . SHOULDER SURGERY    . thumb surger    . WRIST SURGERY      Prior to Admission medications   Medication Sig Start Date End Date Taking? Authorizing Provider  acetaminophen (TYLENOL) 650 MG CR tablet Take 650 mg by mouth every 8 (eight) hours as needed for pain.   Yes [provider]  Calcium Carbonate-Vitamin D (CALCIUM 600 + D PO) Take 1 tablet by mouth 2 (two) times daily.    Yes [provider]  cetirizine (ZYRTEC) 10 MG tablet Take 10 mg by mouth daily as needed for allergies. As needed   Yes [provider]  Multiple Vitamins-Minerals (ONE-A-DAY WOMENS 50 PLUS PO) Take 1 tablet by mouth daily.     Yes [provider]  acetaminophen (TYLENOL) 500 MG tablet Take 500 mg by mouth every 6 (six) hours as needed for mild pain. Only as needed     [provider]    Allergies as of 05/03/2017  . (No Known Allergies)    Family History  Problem Relation Age of Onset  . Diabetes Mother   . Colon cancer Mother 68  . Colon cancer Sister 20  . Diabetes Maternal Aunt   . Depression Sister   . Arthritis Sister     Social History   Social History  . Marital  status: Married    Spouse name: N/A  . Number of children: N/A  . Years of education: N/A   Occupational History  . Not on file.   Social History Main Topics  . Smoking status: Never Smoker  . Smokeless tobacco: Never Used  . Alcohol use No     Comment: occ  . Drug use: No  . Sexual activity: Yes    Birth control/ protection: None, Post-menopausal   Other Topics Concern  . Not on file   Social History Narrative  . No narrative on file    Review of Systems: See HPI, otherwise negative ROS  Physical Exam: BP (!) 163/73   Pulse 68   Temp 98.5 F (36.9 C) (Oral)   Resp 12   Ht 5\' 2"  (1.575 m)   Wt 125 lb (56.7 kg)   SpO2 100%   BMI 22.86 kg/m  General:   Alert,  Well-developed, well-nourished, pleasant and cooperative in NAD Lungs:  Clear throughout to auscultation.   No wheezes, crackles, or rhonchi. No acute distress. Heart:  Regular rate and rhythm; no murmurs, clicks, rubs,  or gallops. Abdomen: Non-distended, normal bowel sounds.  Soft and nontender without appreciable mass or hepatosplenomegaly.  Pulses:  Normal pulses noted. Extremities:  Without clubbing or edema.  1. Impression: Pleasant  66 year old lady with history of colonic adenomas and family hx of CRC.  She's due for a surveilance colonoscopy at this time. 2.  Recommendations:  I have offered patient a surveillance colonoscopy today. The risks, benefits, limitations, alternatives and imponderables have been reviewed with the patient. Questions have been answered. All parties are agreeable.     Notice: This dictation was prepared with Dragon dictation along with smaller phrase technology. Any transcriptional errors that result from this process are unintentional and may not be corrected upon review.

## 2017-06-11 ENCOUNTER — Encounter (HOSPITAL_COMMUNITY): Payer: Self-pay | Admitting: Internal Medicine

## 2017-06-12 ENCOUNTER — Encounter: Payer: Self-pay | Admitting: Internal Medicine

## 2017-07-05 ENCOUNTER — Other Ambulatory Visit: Payer: Self-pay | Admitting: Obstetrics & Gynecology

## 2017-07-05 DIAGNOSIS — Z1231 Encounter for screening mammogram for malignant neoplasm of breast: Secondary | ICD-10-CM

## 2017-08-16 ENCOUNTER — Ambulatory Visit (HOSPITAL_COMMUNITY): Payer: Medicare Other

## 2017-08-17 ENCOUNTER — Ambulatory Visit (HOSPITAL_COMMUNITY)
Admission: RE | Admit: 2017-08-17 | Discharge: 2017-08-17 | Disposition: A | Discharge status: Home / Self Care | Payer: Medicare Other | Source: Ambulatory Visit | Attending: Obstetrics & Gynecology | Admitting: Obstetrics & Gynecology

## 2017-08-17 ENCOUNTER — Encounter (HOSPITAL_COMMUNITY): Payer: Self-pay

## 2017-08-17 DIAGNOSIS — Z1231 Encounter for screening mammogram for malignant neoplasm of breast: Secondary | ICD-10-CM | POA: Diagnosis not present

## 2018-03-29 DIAGNOSIS — E785 Hyperlipidemia, unspecified: Secondary | ICD-10-CM | POA: Diagnosis not present

## 2018-03-29 DIAGNOSIS — Z79899 Other long term (current) drug therapy: Secondary | ICD-10-CM | POA: Diagnosis not present

## 2018-03-29 DIAGNOSIS — R7301 Impaired fasting glucose: Secondary | ICD-10-CM | POA: Diagnosis not present

## 2018-03-29 DIAGNOSIS — M1990 Unspecified osteoarthritis, unspecified site: Secondary | ICD-10-CM | POA: Diagnosis not present

## 2018-04-05 ENCOUNTER — Other Ambulatory Visit (HOSPITAL_COMMUNITY): Payer: Self-pay | Admitting: Internal Medicine

## 2018-04-05 DIAGNOSIS — E785 Hyperlipidemia, unspecified: Secondary | ICD-10-CM | POA: Diagnosis not present

## 2018-04-05 DIAGNOSIS — Z6825 Body mass index (BMI) 25.0-25.9, adult: Secondary | ICD-10-CM | POA: Diagnosis not present

## 2018-04-05 DIAGNOSIS — M8580 Other specified disorders of bone density and structure, unspecified site: Secondary | ICD-10-CM | POA: Diagnosis not present

## 2018-04-05 DIAGNOSIS — Z780 Asymptomatic menopausal state: Secondary | ICD-10-CM

## 2018-04-05 DIAGNOSIS — R7301 Impaired fasting glucose: Secondary | ICD-10-CM | POA: Diagnosis not present

## 2018-04-12 ENCOUNTER — Ambulatory Visit (HOSPITAL_COMMUNITY)
Admission: RE | Admit: 2018-04-12 | Discharge: 2018-04-12 | Disposition: A | Discharge status: Home / Self Care | Payer: Medicare Other | Source: Ambulatory Visit | Attending: Internal Medicine | Admitting: Internal Medicine

## 2018-04-12 DIAGNOSIS — M8580 Other specified disorders of bone density and structure, unspecified site: Secondary | ICD-10-CM | POA: Insufficient documentation

## 2018-04-12 DIAGNOSIS — Z780 Asymptomatic menopausal state: Secondary | ICD-10-CM | POA: Diagnosis not present

## 2018-04-12 DIAGNOSIS — M85851 Other specified disorders of bone density and structure, right thigh: Secondary | ICD-10-CM | POA: Diagnosis not present

## 2018-04-16 DIAGNOSIS — H43811 Vitreous degeneration, right eye: Secondary | ICD-10-CM | POA: Diagnosis not present

## 2018-06-06 DIAGNOSIS — Z23 Encounter for immunization: Secondary | ICD-10-CM | POA: Diagnosis not present

## 2018-07-25 ENCOUNTER — Other Ambulatory Visit (HOSPITAL_COMMUNITY): Payer: Self-pay | Admitting: Internal Medicine

## 2018-07-25 DIAGNOSIS — Z1231 Encounter for screening mammogram for malignant neoplasm of breast: Secondary | ICD-10-CM

## 2018-08-23 ENCOUNTER — Ambulatory Visit (HOSPITAL_COMMUNITY)
Admission: RE | Admit: 2018-08-23 | Discharge: 2018-08-23 | Disposition: A | Discharge status: Home / Self Care | Payer: Medicare Other | Source: Ambulatory Visit | Attending: Internal Medicine | Admitting: Internal Medicine

## 2018-08-23 ENCOUNTER — Encounter (HOSPITAL_COMMUNITY): Payer: Self-pay

## 2018-08-23 DIAGNOSIS — Z1231 Encounter for screening mammogram for malignant neoplasm of breast: Secondary | ICD-10-CM | POA: Insufficient documentation

## 2018-12-02 ENCOUNTER — Other Ambulatory Visit: Payer: Self-pay | Admitting: Adult Health

## 2019-02-05 ENCOUNTER — Other Ambulatory Visit: Payer: Medicare Other | Admitting: Adult Health

## 2019-04-03 ENCOUNTER — Other Ambulatory Visit: Payer: Medicare Other | Admitting: Adult Health

## 2019-04-15 ENCOUNTER — Telehealth: Payer: Self-pay | Admitting: Student

## 2019-04-15 NOTE — Telephone Encounter (Signed)
Called patient and left message informing her that we are not allowing any visitors or children to come to visit with her at this time and we are requiring a mask to be worn during the visit. Asked if she has had any exposure to anyone suspected or confirmed of having COVID-19 or if she is experiencing any of the following: fever, cough, sob, muscle pain, severe headache, sore throat, diarrhea, loss of taste or smell or ear, nose or throat discomfort to call and reschedule.   °

## 2019-04-16 ENCOUNTER — Other Ambulatory Visit: Payer: Medicare Other | Admitting: Student

## 2019-04-16 ENCOUNTER — Other Ambulatory Visit: Payer: Self-pay

## 2019-05-01 DIAGNOSIS — Z79899 Other long term (current) drug therapy: Secondary | ICD-10-CM | POA: Diagnosis not present

## 2019-05-01 DIAGNOSIS — E785 Hyperlipidemia, unspecified: Secondary | ICD-10-CM | POA: Diagnosis not present

## 2019-05-01 DIAGNOSIS — M8580 Other specified disorders of bone density and structure, unspecified site: Secondary | ICD-10-CM | POA: Diagnosis not present

## 2019-05-01 DIAGNOSIS — M1990 Unspecified osteoarthritis, unspecified site: Secondary | ICD-10-CM | POA: Diagnosis not present

## 2019-05-01 DIAGNOSIS — R7301 Impaired fasting glucose: Secondary | ICD-10-CM | POA: Diagnosis not present

## 2019-05-09 DIAGNOSIS — M1990 Unspecified osteoarthritis, unspecified site: Secondary | ICD-10-CM | POA: Diagnosis not present

## 2019-05-09 DIAGNOSIS — R7301 Impaired fasting glucose: Secondary | ICD-10-CM | POA: Diagnosis not present

## 2019-05-09 DIAGNOSIS — E785 Hyperlipidemia, unspecified: Secondary | ICD-10-CM | POA: Diagnosis not present

## 2019-05-20 DIAGNOSIS — Z23 Encounter for immunization: Secondary | ICD-10-CM | POA: Diagnosis not present

## 2019-06-25 DIAGNOSIS — B078 Other viral warts: Secondary | ICD-10-CM | POA: Diagnosis not present

## 2019-08-25 ENCOUNTER — Other Ambulatory Visit (HOSPITAL_COMMUNITY): Payer: Self-pay | Admitting: Internal Medicine

## 2019-08-25 DIAGNOSIS — Z1231 Encounter for screening mammogram for malignant neoplasm of breast: Secondary | ICD-10-CM

## 2019-09-04 ENCOUNTER — Ambulatory Visit (HOSPITAL_COMMUNITY)
Admission: RE | Admit: 2019-09-04 | Discharge: 2019-09-04 | Disposition: A | Discharge status: Home / Self Care | Payer: Medicare Other | Source: Ambulatory Visit | Attending: Internal Medicine | Admitting: Internal Medicine

## 2019-09-04 ENCOUNTER — Other Ambulatory Visit: Payer: Self-pay

## 2019-09-04 DIAGNOSIS — Z1231 Encounter for screening mammogram for malignant neoplasm of breast: Secondary | ICD-10-CM | POA: Insufficient documentation

## 2019-10-02 DIAGNOSIS — Z23 Encounter for immunization: Secondary | ICD-10-CM | POA: Diagnosis not present

## 2019-10-31 DIAGNOSIS — Z23 Encounter for immunization: Secondary | ICD-10-CM | POA: Diagnosis not present

## 2020-05-17 DIAGNOSIS — E785 Hyperlipidemia, unspecified: Secondary | ICD-10-CM | POA: Diagnosis not present

## 2020-05-17 DIAGNOSIS — R7301 Impaired fasting glucose: Secondary | ICD-10-CM | POA: Diagnosis not present

## 2020-05-17 DIAGNOSIS — Z79899 Other long term (current) drug therapy: Secondary | ICD-10-CM | POA: Diagnosis not present

## 2020-05-17 DIAGNOSIS — M1990 Unspecified osteoarthritis, unspecified site: Secondary | ICD-10-CM | POA: Diagnosis not present

## 2020-05-25 DIAGNOSIS — Z23 Encounter for immunization: Secondary | ICD-10-CM | POA: Diagnosis not present

## 2020-06-21 ENCOUNTER — Encounter: Payer: Self-pay | Admitting: Internal Medicine

## 2020-06-28 DIAGNOSIS — M19011 Primary osteoarthritis, right shoulder: Secondary | ICD-10-CM | POA: Diagnosis not present

## 2020-06-28 DIAGNOSIS — R030 Elevated blood-pressure reading, without diagnosis of hypertension: Secondary | ICD-10-CM | POA: Diagnosis not present

## 2020-06-28 DIAGNOSIS — R7301 Impaired fasting glucose: Secondary | ICD-10-CM | POA: Diagnosis not present

## 2020-06-28 DIAGNOSIS — Z6824 Body mass index (BMI) 24.0-24.9, adult: Secondary | ICD-10-CM | POA: Diagnosis not present

## 2020-06-28 DIAGNOSIS — Z0000 Encounter for general adult medical examination without abnormal findings: Secondary | ICD-10-CM | POA: Diagnosis not present

## 2020-08-05 DIAGNOSIS — Z23 Encounter for immunization: Secondary | ICD-10-CM | POA: Diagnosis not present

## 2020-08-24 ENCOUNTER — Other Ambulatory Visit (HOSPITAL_COMMUNITY): Payer: Self-pay | Admitting: Internal Medicine

## 2020-08-24 DIAGNOSIS — Z1231 Encounter for screening mammogram for malignant neoplasm of breast: Secondary | ICD-10-CM

## 2020-09-06 DIAGNOSIS — I10 Essential (primary) hypertension: Secondary | ICD-10-CM | POA: Diagnosis not present

## 2020-09-20 ENCOUNTER — Other Ambulatory Visit: Payer: Self-pay

## 2020-09-20 ENCOUNTER — Ambulatory Visit (HOSPITAL_COMMUNITY)
Admission: RE | Admit: 2020-09-20 | Discharge: 2020-09-20 | Disposition: A | Discharge status: Home / Self Care | Payer: Medicare Other | Source: Ambulatory Visit | Attending: Internal Medicine | Admitting: Internal Medicine

## 2020-09-20 DIAGNOSIS — Z1231 Encounter for screening mammogram for malignant neoplasm of breast: Secondary | ICD-10-CM | POA: Insufficient documentation

## 2020-10-27 ENCOUNTER — Other Ambulatory Visit: Payer: Self-pay

## 2020-10-27 ENCOUNTER — Ambulatory Visit (INDEPENDENT_AMBULATORY_CARE_PROVIDER_SITE_OTHER): Payer: Self-pay | Admitting: *Deleted

## 2020-10-27 VITALS — Ht 62.0 in | Wt 134.4 lb

## 2020-10-27 DIAGNOSIS — Z86010 Personal history of colonic polyps: Secondary | ICD-10-CM

## 2020-10-27 MED ORDER — NA SULFATE-K SULFATE-MG SULF 17.5-3.13-1.6 GM/177ML PO SOLN: 1.0000 | Freq: Once | ORAL | 0 refills | Status: AC

## 2020-10-27 NOTE — Progress Notes (Signed)
Patient had increased sedation requirements during last colonoscopy (5 mg Midazolam, 125 mg Meperidine). She would likely benefit from propofol for this procedure and will need OV arrange.

## 2020-10-27 NOTE — Progress Notes (Signed)
Gastroenterology Pre-Procedure Review  Request Date: 10/27/2020 Requesting Physician: 3 year recall, Last TCS 06/06/2017 by Dr. Gala Romney, multiple fragments of sessile serrated polyp, family hx of colon cancer (mother and sister)  PATIENT REVIEW QUESTIONS: The patient responded to the following health history questions as indicated:    1. Diabetes Melitis: no 2. Joint replacements in the past 12 months: no 3. Major health problems in the past 3 months: no 4. Has an artificial valve or MVP: no 5. Has a defibrillator: no 6. Has been advised in past to take antibiotics in advance of a procedure like teeth cleaning: no 7. Family history of colon cancer: yes, sister: age 79's, mother: age 85's  8. Alcohol Use: no 9. Illicit drug Use: no 10. History of sleep apnea: no  11. History of coronary artery or other vascular stents placed within the last 12 months: no 12. History of any prior anesthesia complications: no 13. Body mass index is 24.58 kg/m.    MEDICATIONS & ALLERGIES:    Patient reports the following regarding taking any blood thinners:   Plavix? no Aspirin? no Coumadin? no Brilinta? no Xarelto? no Eliquis? no Pradaxa? no Savaysa? no Effient? no  Patient confirms/reports the following medications:  Current Outpatient Medications  Medication Sig Dispense Refill  . acetaminophen (TYLENOL) 500 MG tablet Take 500 mg by mouth as needed for mild pain. Only as needed    . Calcium Carbonate-Vitamin D (CALCIUM 600 + D PO) Take 1 tablet by mouth daily.    . cetirizine (ZYRTEC) 10 MG tablet Take 10 mg by mouth daily as needed for allergies. As needed    . Multiple Vitamins-Minerals (ONE-A-DAY WOMENS 50 PLUS PO) Take 1 tablet by mouth daily.    . valsartan (DIOVAN) 80 MG tablet at bedtime.     No current facility-administered medications for this visit.    Patient confirms/reports the following allergies:  No Known Allergies  No orders of the defined types were placed in this  encounter.   AUTHORIZATION INFORMATION Primary Insurance: Medicare,  ID #: [id number] Pre-Cert / Auth required: No, not required  Secondary Insurance: Tricare,  [place] #: [id number] Pre-Cert / Josem Kaufmann required: No, not required  SCHEDULE INFORMATION: Procedure has been scheduled as follows:  Date: 11/24/2020, Time: PM procedure Location: [facility] with Dr. Gala Romney  This Gastroenterology Pre-Precedure Review Form is being routed to the following provider(s): Aliene Altes, PA

## 2020-10-27 NOTE — Patient Instructions (Signed)
[name]  1951/03/16 MRN: [id number]    Remember Covid Test: 11/22/2020 at 9:05 AM.   Procedure Date: 11/24/2020 Time to register: You will receive a call from the hospital a few days before your procedure. Place to register: [facility] Scheduled provider: Dr. Gala Romney    PREPARATION FOR COLONOSCOPY WITH SUPREP BOWEL PREP KIT  Note: Suprep Bowel Prep Kit is a split-dose (2day) regimen. Consumption of BOTH 6-ounce bottles is required for a complete prep.  Please notify us immediately if you are diabetic, take iron supplements, or if you are on Coumadin or any other blood thinners.  Please hold the following medications: n/a                                                                                                                                                  2 DAYS BEFORE PROCEDURE:  DATE: 11/22/2020   DAY: Monday Begin clear liquid diet AFTER your lunch meal. NO SOLID FOODS after this point.  1 DAY BEFORE PROCEDURE:  DATE: 11/23/2020   DAY: Tuesday Continue clear liquids the entire day - NO SOLID FOOD.   Diabetic medications adjustments for today: n/a  At 8:00am: Complete steps 1 through 4 below, using ONE (1) 6-ounce bottle, before going to bed. Step 1:  Pour ONE (1) 6-ounce bottle of SUPREP liquid into the mixing container.  Step 2:  Add cool drinking water to the 16 ounce line on the container and mix.  Note: Dilute the solution concentrate as directed prior to use. Step 3:  DRINK ALL the liquid in the container. Step 4:  You MUST drink an additional two (2) or more 16 ounce containers of water over the next one (1) hour.    At 6:00pm: Complete steps 1 through 4 below, using ONE (1) 6-ounce bottle, before going to bed. Step 1:  Pour ONE (1) 6-ounce bottle of SUPREP liquid into the mixing container.  Step 2:  Add cool drinking water to the 16 ounce line on the container and mix.  Note: Dilute the solution concentrate as directed prior to use. Step 3:  DRINK  ALL the liquid in the container. Step 4:  You MUST drink an additional two (2) or more 16 ounce containers of water over the next one (1) hour.   Continue clear liquids until 4 hours before your scheduled procedure time.  Nothing by mouth 4 hours before your scheduled procedure time.  DAY OF PROCEDURE:   DATE: 11/24/2020   DAY: Wednesday If you take medications for your heart, blood pressure, or breathing, you may take these medications.  Diabetic medications adjustments for today: n/a  You may take your morning medications with sip of water unless we have instructed otherwise.    Please see below for Dietary Information.  CLEAR LIQUIDS INCLUDE:  Water Jello (NOT red in  color)   Ice Popsicles (NOT red in color)   Tea (sugar ok, no milk/cream) Powdered fruit flavored drinks  Coffee (sugar ok, no milk/cream) Gatorade/ Lemonade/ Kool-Aid  (NOT red in color)   Juice: apple, white grape, white cranberry Soft drinks  Clear bullion, consomme, broth (fat free beef/chicken/vegetable)  Carbonated beverages (any kind)  Strained chicken noodle soup Hard Candy   Remember: Clear liquids are liquids that will allow you to see your fingers on the other side of a clear glass. Be sure liquids are NOT red in color, and not cloudy, but CLEAR.  DO NOT EAT OR DRINK ANY OF THE FOLLOWING:  Dairy products of any kind   Cranberry juice Tomato juice / V8 juice   Grapefruit juice Orange juice     Red grape juice  Do not eat any solid foods, including such foods as: cereal, oatmeal, yogurt, fruits, vegetables, creamed soups, eggs, bread, crackers, pureed foods in a blender, etc.   HELPFUL HINTS FOR DRINKING PREP SOLUTION:   Make sure prep is extremely cold. Mix and refrigerate the the morning of the prep. You may also put in the freezer.   You may try mixing some Crystal Light or Country Time Lemonade if you prefer. Mix in small amounts; add more if necessary.  Try drinking through a straw  Rinse mouth  with water or a mouthwash between glasses, to remove after-taste.  Try sipping on a cold beverage /ice/ popsicles between glasses of prep.  Place a piece of sugar-free hard candy in mouth between glasses.  If you become nauseated, try consuming smaller amounts, or stretch out the time between glasses. Stop for 30-60 minutes, then slowly start back drinking.     OTHER INSTRUCTIONS  You will need a responsible adult at least 72 years of age to accompany you and drive you home. This person must remain in the waiting room during your procedure. The hospital will cancel your procedure if you do not have a responsible adult with you.   1. Wear loose fitting clothing that is easily removed. 2. Leave jewelry and other valuables at home.  3. Remove all body piercing jewelry and leave at home. 4. Total time from sign-in until discharge is approximately 2-3 hours. 5. You should go home directly after your procedure and rest. You can resume normal activities the day after your procedure. 6. The day of your procedure you should not:  Drive  Make legal decisions  Operate machinery  Drink alcohol  Return to work   You may call the office (Dept: [phone]) before 5:00pm, or page the doctor on call [phone]) after 5:00pm, for further instructions, if necessary.   Insurance Information YOU WILL NEED TO CHECK WITH YOUR INSURANCE COMPANY FOR THE BENEFITS OF COVERAGE YOU HAVE FOR THIS PROCEDURE.  UNFORTUNATELY, NOT ALL INSURANCE COMPANIES HAVE BENEFITS TO COVER ALL OR PART OF THESE TYPES OF PROCEDURES.  IT IS YOUR RESPONSIBILITY TO CHECK YOUR BENEFITS, HOWEVER, WE WILL BE GLAD TO ASSIST YOU WITH ANY CODES YOUR INSURANCE COMPANY MAY NEED.    PLEASE NOTE THAT MOST INSURANCE COMPANIES WILL NOT COVER A SCREENING COLONOSCOPY FOR PEOPLE UNDER THE AGE OF 50  IF YOU HAVE BCBS INSURANCE, YOU MAY HAVE BENEFITS FOR A SCREENING COLONOSCOPY BUT IF POLYPS ARE FOUND THE DIAGNOSIS WILL CHANGE AND THEN YOU  MAY HAVE A DEDUCTIBLE THAT WILL NEED TO BE MET. SO PLEASE MAKE SURE YOU CHECK YOUR BENEFITS FOR A SCREENING COLONOSCOPY AS WELL AS A DIAGNOSTIC COLONOSCOPY.

## 2020-10-28 NOTE — Progress Notes (Signed)
Spoke with pt.  She was made aware that she had increased sedation last colonoscopy (5 mg Midazolam, 125 mg Meperidine). She was informed that she would need OV to arrange TCS to discuss sedation.  Pt voiced understanding.  OV scheduled for 11/23/2020 at 3:30 with Dr. Gala Romney.

## 2020-11-22 ENCOUNTER — Other Ambulatory Visit (HOSPITAL_COMMUNITY): Payer: Medicare Other

## 2020-11-23 ENCOUNTER — Other Ambulatory Visit: Payer: Self-pay

## 2020-11-23 ENCOUNTER — Encounter: Payer: Self-pay | Admitting: Internal Medicine

## 2020-11-23 ENCOUNTER — Ambulatory Visit (INDEPENDENT_AMBULATORY_CARE_PROVIDER_SITE_OTHER): Payer: Medicare Other | Admitting: Internal Medicine

## 2020-11-23 VITALS — BP 156/83 | HR 82 | Temp 98.0°F | Ht 62.0 in | Wt 133.8 lb

## 2020-11-23 DIAGNOSIS — Z800 Family history of malignant neoplasm of digestive organs: Secondary | ICD-10-CM

## 2020-11-23 DIAGNOSIS — Z86010 Personal history of colonic polyps: Secondary | ICD-10-CM | POA: Diagnosis not present

## 2020-11-23 NOTE — Patient Instructions (Signed)
We will schedule a surveillance colonoscopy given personal history of polyps and family history of colon cancer.  ASA 2/propofol  Further recommendations to follow after colonoscopy has been performed.

## 2020-11-23 NOTE — Progress Notes (Signed)
Primary Care Physician:  Asencion Noble, MD Primary Gastroenterologist:  Dr. Gala Romney  Pre-Procedure History & Physical: HPI:  [name] is a 70 y.o. female here to set up a surveillance colonoscopy given history of serrated and adenomatous polyps removed over time.  56mm polyp removed in 2018.  Patient reminded me that her sister succumbed to colon cancer in her 59s. Fortunately, [name] does not have any bowel symptoms.  No rectal bleeding abdominal pain or change in bowel habits otherwise. She did take a fair amount of conscious sedation during her last procedure and she reports recalling that her experience was pleasant.  Past Medical History:  Diagnosis Date  . Abnormal Pap smear   . Arthritis   . Colon polyps   . Hx of seasonal allergies   . Postmenopausal HRT (hormone replacement therapy) 11/14/2012  . Vaginal Pap smear, abnormal     Past Surgical History:  Procedure Laterality Date  . COLONOSCOPY N/A 04/15/2014   Procedure: COLONOSCOPY;  Surgeon: Daneil Dolin, MD;  Location: [facility];  Service: Endoscopy;  Laterality: N/A;  9:30 AM  . COLONOSCOPY N/A 06/06/2017   Procedure: COLONOSCOPY;  Surgeon: Daneil Dolin, MD;  Location: [facility];  Service: Endoscopy;  Laterality: N/A;  8:30 AM  . LT KNEE SURGERY    . POLYPECTOMY  06/06/2017   Procedure: POLYPECTOMY;  Surgeon: Daneil Dolin, MD;  Location: [facility];  Service: Endoscopy;;  Ascending colon x2 (HS)  . SHOULDER SURGERY    . thumb surger    . WRIST SURGERY      Prior to Admission medications   Medication Sig Start Date End Date Taking? Authorizing Provider  acetaminophen (TYLENOL) 500 MG tablet Take 500 mg by mouth as needed for mild pain. Only as needed   Yes [provider]  Calcium Carbonate-Vitamin D (CALCIUM 600 + D PO) Take 1 tablet by mouth daily.   Yes [provider]  cetirizine (ZYRTEC) 10 MG tablet Take 10 mg by mouth daily as needed for allergies. As needed   Yes  [provider]  Multiple Vitamins-Minerals (ONE-A-DAY WOMENS 50 PLUS PO) Take 1 tablet by mouth daily.   Yes [provider]  valsartan (DIOVAN) 80 MG tablet Take by mouth daily. 08/04/20  Yes [provider]    Allergies as of 11/23/2020  . (No Known Allergies)    Family History  Problem Relation Age of Onset  . Diabetes Mother   . Colon cancer Mother 48  . Colon cancer Sister 68  . Diabetes Maternal Aunt   . Depression Sister   . Arthritis Sister     Social History   Socioeconomic History  . Marital status: Married    Spouse name: Not on file  . Number of children: Not on file  . Years of education: Not on file  . Highest education level: Not on file  Occupational History  . Not on file  Tobacco Use  . Smoking status: Never Smoker  . Smokeless tobacco: Never Used  Vaping Use  . Vaping Use: Never used  Substance and Sexual Activity  . Alcohol use: No    Comment: occ  . Drug use: No  . Sexual activity: Yes    Birth control/protection: None, Post-menopausal  Other Topics Concern  . Not on file  Social History Narrative  . Not on file   Social Determinants of Health   Financial Resource Strain: Not on file  Food Insecurity:  Not on file  Transportation Needs: Not on file  Physical Activity: Not on file  Stress: Not on file  Social Connections: Not on file  Intimate Partner Violence: Not on file    Review of Systems: See HPI, otherwise negative ROS  Physical Exam: BP (!) 156/83   Pulse 82   Temp 98 F (36.7 C) (Temporal)   Ht 5\' 2"  (1.575 m)   Wt 133 lb 12.8 oz (60.7 kg)   BMI 24.47 kg/m  General:   Alert,  Well-developed, well-nourished, pleasant and cooperative in NAD Mouth:  No deformity or lesions. Neck:  Supple; no masses or thyromegaly. No significant cervical adenopathy. Lungs:  Clear throughout to auscultation.   No wheezes, crackles, or rhonchi. No acute distress. Heart:  Regular rate and rhythm; no murmurs,  clicks, rubs,  or gallops. Abdomen: Non-distended, normal bowel sounds.  Soft and nontender without appreciable mass or hepatosplenomegaly.  Pulses:  Normal pulses noted. Extremities:  Without clubbing or edema.  Impression/Plan: Pleasant 70 year old lady with a positive family history in a first-degree relative at a young age as well as a personal history of multiple colonic adenomas removed over time. She is now due for a surveillance colonoscopy per plan.  I have offered this nice lady surveillance colonoscopy in the near future.  We will utilize propofol  /  ASA 2 The risks, benefits, limitations, alternatives and imponderables have been reviewed with the patient. Questions have been answered. All parties are agreeable.   Further recommendations to following completion of the procedure.   Notice: This dictation was prepared with Dragon dictation along with smaller phrase technology. Any transcriptional errors that result from this process are unintentional and may not be corrected upon review.

## 2020-11-24 ENCOUNTER — Telehealth: Payer: Self-pay | Admitting: *Deleted

## 2020-11-24 ENCOUNTER — Encounter: Payer: Self-pay | Admitting: *Deleted

## 2020-11-24 NOTE — Telephone Encounter (Signed)
LMOVM for pt to call back to schedule TCS with Dr. Gala Romney, ASA 2 propofol

## 2020-11-24 NOTE — Telephone Encounter (Signed)
Patient returned call. She has been scheduled for 5/26 for procedure. Aware needs covid test 2 days prior. Advised will mail prep instructions with covid test appt. Confirmed address. Pt already has suprep at home.

## 2020-12-06 DIAGNOSIS — I10 Essential (primary) hypertension: Secondary | ICD-10-CM | POA: Diagnosis not present

## 2020-12-06 DIAGNOSIS — M79641 Pain in right hand: Secondary | ICD-10-CM | POA: Diagnosis not present

## 2021-01-11 ENCOUNTER — Other Ambulatory Visit (HOSPITAL_COMMUNITY)
Admission: RE | Admit: 2021-01-11 | Discharge: 2021-01-11 | Disposition: A | Discharge status: Home / Self Care | Payer: Medicare Other | Source: Ambulatory Visit | Attending: Internal Medicine | Admitting: Internal Medicine

## 2021-01-11 ENCOUNTER — Other Ambulatory Visit: Payer: Self-pay

## 2021-01-11 DIAGNOSIS — Z01812 Encounter for preprocedural laboratory examination: Secondary | ICD-10-CM | POA: Insufficient documentation

## 2021-01-11 DIAGNOSIS — Z20822 Contact with and (suspected) exposure to covid-19: Secondary | ICD-10-CM | POA: Diagnosis not present

## 2021-01-11 LAB — SARS CORONAVIRUS 2 (TAT 6-24 HRS): SARS Coronavirus 2: NEGATIVE

## 2021-01-13 ENCOUNTER — Encounter (HOSPITAL_COMMUNITY)
Admission: RE | Disposition: A | Discharge status: Home / Self Care | Payer: Self-pay | Source: Home / Self Care | Attending: Internal Medicine

## 2021-01-13 ENCOUNTER — Ambulatory Visit (HOSPITAL_COMMUNITY): Payer: Medicare Other | Admitting: Anesthesiology

## 2021-01-13 ENCOUNTER — Other Ambulatory Visit: Payer: Self-pay

## 2021-01-13 ENCOUNTER — Ambulatory Visit (HOSPITAL_COMMUNITY)
Admission: RE | Admit: 2021-01-13 | Discharge: 2021-01-13 | Disposition: A | Discharge status: Home / Self Care | Payer: Medicare Other | Attending: Internal Medicine | Admitting: Internal Medicine

## 2021-01-13 ENCOUNTER — Encounter (HOSPITAL_COMMUNITY): Payer: Self-pay | Admitting: Internal Medicine

## 2021-01-13 DIAGNOSIS — Z79899 Other long term (current) drug therapy: Secondary | ICD-10-CM | POA: Diagnosis not present

## 2021-01-13 DIAGNOSIS — Z800 Family history of malignant neoplasm of digestive organs: Secondary | ICD-10-CM | POA: Insufficient documentation

## 2021-01-13 DIAGNOSIS — D122 Benign neoplasm of ascending colon: Secondary | ICD-10-CM | POA: Diagnosis not present

## 2021-01-13 DIAGNOSIS — Z86010 Personal history of colonic polyps: Secondary | ICD-10-CM | POA: Diagnosis not present

## 2021-01-13 DIAGNOSIS — K635 Polyp of colon: Secondary | ICD-10-CM | POA: Diagnosis not present

## 2021-01-13 DIAGNOSIS — Z1211 Encounter for screening for malignant neoplasm of colon: Secondary | ICD-10-CM | POA: Diagnosis not present

## 2021-01-13 DIAGNOSIS — Z09 Encounter for follow-up examination after completed treatment for conditions other than malignant neoplasm: Secondary | ICD-10-CM | POA: Diagnosis present

## 2021-01-13 HISTORY — PX: COLONOSCOPY WITH PROPOFOL: SHX5780

## 2021-01-13 HISTORY — DX: Essential (primary) hypertension: I10

## 2021-01-13 HISTORY — PX: POLYPECTOMY: SHX149

## 2021-01-13 SURGERY — COLONOSCOPY WITH PROPOFOL
Anesthesia: General

## 2021-01-13 MED ORDER — PROPOFOL 10 MG/ML IV BOLUS
INTRAVENOUS | Status: DC | PRN
  Administered 2021-01-13: 125 ug/kg/min via INTRAVENOUS
  Administered 2021-01-13: 80 mg via INTRAVENOUS

## 2021-01-13 MED ORDER — PHENYLEPHRINE 40 MCG/ML (10ML) SYRINGE FOR IV PUSH (FOR BLOOD PRESSURE SUPPORT)
PREFILLED_SYRINGE | INTRAVENOUS | Status: AC
  Filled 2021-01-13: qty 10

## 2021-01-13 MED ORDER — STERILE WATER FOR IRRIGATION IR SOLN
Status: DC | PRN
  Administered 2021-01-13: 200 mL

## 2021-01-13 MED ORDER — LACTATED RINGERS IV SOLN: INTRAVENOUS | Status: DC

## 2021-01-13 MED ORDER — PROPOFOL 10 MG/ML IV BOLUS
INTRAVENOUS | Status: AC
  Filled 2021-01-13: qty 20

## 2021-01-13 MED ORDER — EPHEDRINE 5 MG/ML INJ
INTRAVENOUS | Status: AC
  Filled 2021-01-13: qty 10

## 2021-01-13 NOTE — Discharge Instructions (Signed)
Colonoscopy Discharge Instructions  Read the instructions outlined below and refer to this sheet in the next few weeks. These discharge instructions provide you with general information on caring for yourself after you leave the hospital. Your doctor may also give you specific instructions. While your treatment has been planned according to the most current medical practices available, unavoidable complications occasionally occur. If you have any problems or questions after discharge, call Dr. Gala Romney at [phone]. ACTIVITY  You may resume your regular activity, but move at a slower pace for the next 24 hours.   Take frequent rest periods for the next 24 hours.   Walking will help get rid of the air and reduce the bloated feeling in your belly (abdomen).   No driving for 24 hours (because of the medicine (anesthesia) used during the test).    Do not sign any important legal documents or operate any machinery for 24 hours (because of the anesthesia used during the test).  NUTRITION  Drink plenty of fluids.   You may resume your normal diet as instructed by your doctor.   Begin with a light meal and progress to your normal diet. Heavy or fried foods are harder to digest and may make you feel sick to your stomach (nauseated).   Avoid alcoholic beverages for 24 hours or as instructed.  MEDICATIONS  You may resume your normal medications unless your doctor tells you otherwise.  WHAT YOU CAN EXPECT TODAY  Some feelings of bloating in the abdomen.   Passage of more gas than usual.   Spotting of blood in your stool or on the toilet paper.  IF YOU HAD POLYPS REMOVED DURING THE COLONOSCOPY:  No aspirin products for 7 days or as instructed.   No alcohol for 7 days or as instructed.   Eat a soft diet for the next 24 hours.  FINDING OUT THE RESULTS OF YOUR TEST Not all test results are available during your visit. If your test results are not back during the visit, make an appointment  with your caregiver to find out the results. Do not assume everything is normal if you have not heard from your caregiver or the medical facility. It is important for you to follow up on all of your test results.  SEEK IMMEDIATE MEDICAL ATTENTION IF:  You have more than a spotting of blood in your stool.   Your belly is swollen (abdominal distention).   You are nauseated or vomiting.   You have a temperature over 101.   You have abdominal pain or discomfort that is severe or gets worse throughout the day.   1 polyp found and removed today  Further recommendations to follow pending review of pathology report  At patient request I called [name] at [phone] -reviewed findings and recommendations    Colon Polyps  Colon polyps are tissue growths inside the colon, which is part of the large intestine. They are one of the types of polyps that can grow in the body. A polyp may be a round bump or a mushroom-shaped growth. You could have one polyp or more than one. Most colon polyps are noncancerous (benign). However, some colon polyps can become cancerous over time. Finding and removing the polyps early can help prevent this. What are the causes? The exact cause of colon polyps is not known. What increases the risk? The following factors may make you more likely to develop this condition:  Having a family history of colorectal cancer or colon polyps.  Being older than 72 years of age.  Being younger than 72 years of age and having a significant family history of colorectal cancer or colon polyps or a genetic condition that puts you at higher risk of getting colon polyps.  Having inflammatory bowel disease, such as ulcerative colitis or Crohn's disease.  Having certain conditions passed from parent to child (hereditary conditions), such as: ? Familial adenomatous polyposis (FAP). ? Lynch syndrome. ? Turcot syndrome. ? Peutz-Jeghers syndrome. ? MUTYH-associated polyposis  (MAP).  Being overweight.  Certain lifestyle factors. These include smoking cigarettes, drinking too much alcohol, not getting enough exercise, and eating a diet that is high in fat and red meat and low in fiber.  Having had childhood cancer that was treated with radiation of the abdomen. What are the signs or symptoms? Many times, there are no symptoms. If you have symptoms, they may include:  Blood coming from the rectum during a bowel movement.  Blood in the stool (feces). The blood may be bright red or very dark in color.  Pain in the abdomen.  A change in bowel habits, such as constipation or diarrhea. How is this diagnosed? This condition is diagnosed with a colonoscopy. This is a procedure in which a lighted, flexible scope is inserted into the opening between the buttocks (anus) and then passed into the colon to examine the area. Polyps are sometimes found when a colonoscopy is done as part of routine cancer screening tests. How is this treated? This condition is treated by removing any polyps that are found. Most polyps can be removed during a colonoscopy. Those polyps will then be tested for cancer. Additional treatment may be needed depending on the results of testing. Follow these instructions at home: Eating and drinking  Eat foods that are high in fiber, such as fruits, vegetables, and whole grains.  Eat foods that are high in calcium and vitamin D, such as milk, cheese, yogurt, eggs, liver, fish, and broccoli.  Limit foods that are high in fat, such as fried foods and desserts.  Limit the amount of red meat, precooked or cured meat, or other processed meat that you eat, such as hot dogs, sausages, bacon, or meat loaves.  Limit sugary drinks.   Lifestyle  Maintain a healthy weight, or lose weight if recommended by your health care provider.  Exercise every day or as told by your health care provider.  Do not use any products that contain nicotine or tobacco, such  as cigarettes, e-cigarettes, and chewing tobacco. If you need help quitting, ask your health care provider.  Do not drink alcohol if: ? Your health care provider tells you not to drink. ? You are pregnant, may be pregnant, or are planning to become pregnant.  If you drink alcohol: ? Limit how much you use to:  0-1 drink a day for women.  0-2 drinks a day for men. ? Know how much alcohol is in your drink. In the [place] one drink equals one 12 oz bottle of beer (355 mL), one 5 oz glass of wine (148 mL), or one 1 oz glass of hard liquor (44 mL). General instructions  Take over-the-counter and prescription medicines only as told by your health care provider.  Keep all follow-up visits. This is important. This includes having regularly scheduled colonoscopies. Talk to your health care provider about when you need a colonoscopy. Contact a health care provider if:  You have new or worsening bleeding during a bowel movement.  You have new  or increased blood in your stool.  You have a change in bowel habits.  You lose weight for no known reason. Summary  Colon polyps are tissue growths inside the colon, which is part of the large intestine. They are one type of polyp that can grow in the body.  Most colon polyps are noncancerous (benign), but some can become cancerous over time.  This condition is diagnosed with a colonoscopy.  This condition is treated by removing any polyps that are found. Most polyps can be removed during a colonoscopy. This information is not intended to replace advice given to you by your health care provider. Make sure you discuss any questions you have with your health care provider. Document Revised: 11/26/2019 Document Reviewed: 11/26/2019 Elsevier Patient Education  2021 [employer].

## 2021-01-13 NOTE — Transfer of Care (Signed)
Immediate Anesthesia Transfer of Care Note  Patient: [name]  Procedure(s) Performed: COLONOSCOPY WITH PROPOFOL (N/A ) POLYPECTOMY INTESTINAL  Patient Location: Endoscopy Unit  Anesthesia Type:General  Level of Consciousness: awake, alert , oriented and patient cooperative  Airway & Oxygen Therapy: Patient Spontanous Breathing  Post-op Assessment: Report given to RN, Post -op Vital signs reviewed and stable and Patient moving all extremities  Post vital signs: Reviewed and stable  Last Vitals:  Vitals Value Taken Time  BP    Temp    Pulse    Resp    SpO2      Last Pain:  Vitals:   01/13/21 1245  TempSrc:   PainSc: 0-No pain      Patients Stated Pain Goal: 10 (28/00/34 9179)  Complications: No complications documented.

## 2021-01-13 NOTE — Anesthesia Preprocedure Evaluation (Addendum)
Anesthesia Evaluation  Patient identified by MRN, date of birth, ID band Patient awake    Reviewed: Allergy & Precautions, NPO status , Unable to perform ROS - Chart review only  Airway Mallampati: II  TM Distance: >3 FB Neck ROM: Full    Dental  (+) Dental Advisory Given, Teeth Intact Crowns :   Pulmonary neg pulmonary ROS,    Pulmonary exam normal breath sounds clear to auscultation       Cardiovascular Exercise Tolerance: Good hypertension, Pt. on medications Normal cardiovascular exam Rhythm:Regular Rate:Normal     Neuro/Psych negative neurological ROS     GI/Hepatic negative GI ROS, Neg liver ROS,   Endo/Other  negative endocrine ROS  Renal/GU negative Renal ROS     Musculoskeletal  (+) Arthritis ,   Abdominal   Peds  Hematology negative hematology ROS (+)   Anesthesia Other Findings   Reproductive/Obstetrics                            Anesthesia Physical Anesthesia Plan  ASA: II  Anesthesia Plan: General   Post-op Pain Management:    Induction: Intravenous  PONV Risk Score and Plan: TIVA  Airway Management Planned: Nasal Cannula and Natural Airway  Additional Equipment:   Intra-op Plan:   Post-operative Plan:   Informed Consent: I have reviewed the patients History and Physical, chart, labs and discussed the procedure including the risks, benefits and alternatives for the proposed anesthesia with the patient or authorized representative who has indicated his/her understanding and acceptance.     Dental advisory given  Plan Discussed with: CRNA and Surgeon  Anesthesia Plan Comments:         Anesthesia Quick Evaluation

## 2021-01-13 NOTE — H&P (Signed)
@LOGO @   Primary Care Physician:  Asencion Noble, MD Primary Gastroenterologist:  Dr. Gala Romney  Pre-Procedure History & Physical: HPI:  [name] is a 70 y.o. female here for surveillance colonoscopy.  History of multiple serrated and adenomatous polyps removed over time.  Past Medical History:  Diagnosis Date  . Abnormal Pap smear   . Arthritis   . Colon polyps   . Hx of seasonal allergies   . Hypertension   . Postmenopausal HRT (hormone replacement therapy) 11/14/2012  . Vaginal Pap smear, abnormal     Past Surgical History:  Procedure Laterality Date  . COLONOSCOPY N/A 04/15/2014   Procedure: COLONOSCOPY;  Surgeon: Daneil Dolin, MD;  [facility];  Service: Endoscopy;  Laterality: N/A;  9:30 AM  . COLONOSCOPY N/A 06/06/2017   Procedure: COLONOSCOPY;  Surgeon: Daneil Dolin, MD;  [facility];  Service: Endoscopy;  Laterality: N/A;  8:30 AM  . LT KNEE SURGERY    . POLYPECTOMY  06/06/2017   Procedure: POLYPECTOMY;  Surgeon: Daneil Dolin, MD;  [facility]: [facility];  Service: Endoscopy;;  Ascending colon x2 (HS)  . SHOULDER SURGERY    . thumb surger    . WRIST SURGERY      Prior to Admission medications   Medication Sig Start Date End Date Taking? Authorizing Provider  acetaminophen (TYLENOL) 500 MG tablet Take 500 mg by mouth as needed for mild pain. Only as needed   Yes [provider]  Calcium Carbonate-Vitamin D (CALCIUM 600 + D PO) Take 1 tablet by mouth daily.   Yes [provider]  cetirizine (ZYRTEC) 10 MG tablet Take 10 mg by mouth daily as needed for allergies. As needed   Yes [provider]  Multiple Vitamins-Minerals (ONE-A-DAY WOMENS 50 PLUS PO) Take 1 tablet by mouth daily.   Yes [provider]  valsartan (DIOVAN) 80 MG tablet Take 80 mg by mouth at bedtime. 08/04/20  Yes [provider]    Allergies as of 11/24/2020  . (No Known Allergies)    Family History  Problem Relation Age  of Onset  . Diabetes Mother   . Colon cancer Mother 75  . Colon cancer Sister 60  . Diabetes Maternal Aunt   . Depression Sister   . Arthritis Sister     Social History   Socioeconomic History  . Marital status: Married    Spouse name: Not on file  . Number of children: Not on file  . Years of education: Not on file  . Highest education level: Not on file  Occupational History  . Not on file  Tobacco Use  . Smoking status: Never Smoker  . Smokeless tobacco: Never Used  Vaping Use  . Vaping Use: Never used  Substance and Sexual Activity  . Alcohol use: No    Comment: occ  . Drug use: No  . Sexual activity: Yes    Birth control/protection: None, Post-menopausal  Other Topics Concern  . Not on file  Social History Narrative  . Not on file   Social Determinants of Health   Financial Resource Strain: Not on file  Food Insecurity: Not on file  Transportation Needs: Not on file  Physical Activity: Not on file  Stress: Not on file  Social Connections: Not on file  Intimate Partner Violence: Not on file    Review of Systems: See HPI, otherwise negative ROS  Physical Exam: BP (!) 179/75   Temp 97.9 F (36.6 C) (  Oral)   Resp 20   Ht 5\' 2"  (1.575 m)   Wt 59 kg   SpO2 98%   BMI 23.78 kg/m  General:   Alert,  Well-developed, well-nourished, pleasant and cooperative in NAD Neck:  Supple; no masses or thyromegaly. No significant cervical adenopathy. Lungs:  Clear throughout to auscultation.   No wheezes, crackles, or rhonchi. No acute distress. Heart:  Regular rate and rhythm; no murmurs, clicks, rubs,  or gallops. Abdomen: Non-distended, normal bowel sounds.  Soft and nontender without appreciable mass or hepatosplenomegaly.  Pulses:  Normal pulses noted. Extremities:  Without clubbing or edema.  Impression/Plan: 70 year old lady here for surveillance colonoscopy.  Multiple polyps removed over time.  Positive family history of colon cancer in first-degree relative  at a young age. I have offered the patient a surveillance colonoscopy today. The risks, benefits, limitations, alternatives and imponderables have been reviewed with the patient. Questions have been answered. All parties are agreeable.      Notice: This dictation was prepared with Dragon dictation along with smaller phrase technology. Any transcriptional errors that result from this process are unintentional and may not be corrected upon review.

## 2021-01-13 NOTE — Anesthesia Postprocedure Evaluation (Signed)
Anesthesia Post Note  Patient: [name]  Procedure(s) Performed: COLONOSCOPY WITH PROPOFOL (N/A ) POLYPECTOMY INTESTINAL  Patient location during evaluation: Endoscopy Anesthesia Type: General Level of consciousness: awake and alert and oriented Pain management: pain level controlled Vital Signs Assessment: post-procedure vital signs reviewed and stable Respiratory status: spontaneous breathing and respiratory function stable Cardiovascular status: stable and blood pressure returned to baseline Postop Assessment: no apparent nausea or vomiting Anesthetic complications: no   No complications documented.   Last Vitals:  Vitals:   01/13/21 1117 01/13/21 1310  BP: (!) 179/75 (!) 109/57  Pulse:  77  Resp: 20 17  Temp: 36.6 C (!) 36.4 C  SpO2: 98% 96%    Last Pain:  Vitals:   01/13/21 1310  TempSrc: Oral  PainSc: 0-No pain                 Jaikob Borgwardt C Yanin Muhlestein

## 2021-01-14 ENCOUNTER — Encounter: Payer: Self-pay | Admitting: Internal Medicine

## 2021-01-14 LAB — SURGICAL PATHOLOGY

## 2021-01-14 NOTE — Op Note (Signed)
[facility] Patient Name: [name] Procedure Date: 01/13/2021 11:58 AM MRN: [id number] Date of Birth: 1951-02-01 Attending MD: [name] , MD CSN: [id number] Age: 70 Admit Type: Outpatient Procedure:                Colonoscopy Indications:              High risk colon cancer surveillance: Personal                            history of colonic polyps Providers:                Paula Richards, MD, [name], Paula                            Risa Foster, Technician Referring MD:              Medicines:                Propofol per Anesthesia Complications:            No immediate complications. Estimated Blood Loss:     Estimated blood loss was minimal. Procedure:                Pre-Anesthesia Assessment:                           - Prior to the procedure, a History and Physical                            was performed, and patient medications and                            allergies were reviewed. The patient's tolerance of                            previous anesthesia was also reviewed. The risks                            and benefits of the procedure and the sedation                            options and risks were discussed with the patient.                            All questions were answered, and informed consent                            was obtained. Prior Anticoagulants: The patient has                            taken no previous anticoagulant or antiplatelet                            agents. ASA Grade Assessment: III - A patient with  severe systemic disease. After reviewing the risks                            and benefits, the patient was deemed in                            satisfactory condition to undergo the procedure.                           After obtaining informed consent, the colonoscope                            was passed under direct vision. Throughout the                            procedure,  the patient's blood pressure, pulse, and                            oxygen saturations were monitored continuously. The                            CF-HQ190L ([id number]) scope was introduced through                            the anus and advanced to the the cecum, identified                            by appendiceal orifice and ileocecal valve. The                            colonoscopy was performed without difficulty. The                            patient tolerated the procedure well. The quality                            of the bowel preparation was adequate. Scope In: 12:50:13 PM Scope Out: 1:07:18 PM Scope Withdrawal Time: 0 hours 11 minutes 31 seconds  Total Procedure Duration: 0 hours 17 minutes 5 seconds  Findings:      The perianal and digital rectal examinations were normal.      A 5 mm polyp was found in the ascending colon. The polyp was sessile.       The polyp was removed with a cold snare. Resection and retrieval were       complete. Estimated blood loss was minimal.      The exam was otherwise without abnormality on direct and retroflexion       views. Impression:               - One 5 mm polyp in the ascending colon, removed                            with a cold snare. Resected and retrieved.                           -  The examination was otherwise normal on direct                            and retroflexion views. Moderate Sedation:      Moderate (conscious) sedation was personally administered by an       anesthesia professional. The following parameters were monitored: oxygen       saturation, heart rate, blood pressure, respiratory rate, EKG, adequacy       of pulmonary ventilation, and response to care. Recommendation:           - Patient has a contact number available for                            emergencies. The signs and symptoms of potential                            delayed complications were discussed with the                            patient. Return  to normal activities tomorrow.                            Written discharge instructions were provided to the                            patient.                           - Resume previous diet.                           - Continue present medications.                           - Repeat colonoscopy date to be determined after                            pending pathology results are reviewed for                            surveillance.                           - Return to GI clinic (date not yet determined). Procedure Code(s):        --- Professional ---                           [phone], Colonoscopy, flexible; with removal of                            tumor(s), polyp(s), or other lesion(s) by snare                            technique Diagnosis Code(s):        --- Professional ---  Z86.010, Personal history of colonic polyps                           K63.5, Polyp of colon CPT copyright 2019 [employer]. All rights reserved. The codes documented in this report are preliminary and upon coder review may  be revised to meet current compliance requirements. [name]. Paula Fick, MD Paula Richards, MD 01/14/2021 4:43:36 PM This report has been signed electronically. Number of Addenda: 0

## 2021-01-21 ENCOUNTER — Encounter (HOSPITAL_COMMUNITY): Payer: Self-pay | Admitting: Internal Medicine

## 2021-03-07 DIAGNOSIS — I10 Essential (primary) hypertension: Secondary | ICD-10-CM | POA: Diagnosis not present

## 2021-03-07 DIAGNOSIS — M1990 Unspecified osteoarthritis, unspecified site: Secondary | ICD-10-CM | POA: Diagnosis not present

## 2021-06-01 DIAGNOSIS — Z23 Encounter for immunization: Secondary | ICD-10-CM | POA: Diagnosis not present

## 2021-06-23 DIAGNOSIS — R7301 Impaired fasting glucose: Secondary | ICD-10-CM | POA: Diagnosis not present

## 2021-06-23 DIAGNOSIS — E785 Hyperlipidemia, unspecified: Secondary | ICD-10-CM | POA: Diagnosis not present

## 2021-06-23 DIAGNOSIS — M1990 Unspecified osteoarthritis, unspecified site: Secondary | ICD-10-CM | POA: Diagnosis not present

## 2021-06-23 DIAGNOSIS — R030 Elevated blood-pressure reading, without diagnosis of hypertension: Secondary | ICD-10-CM | POA: Diagnosis not present

## 2021-06-23 DIAGNOSIS — Z79899 Other long term (current) drug therapy: Secondary | ICD-10-CM | POA: Diagnosis not present

## 2021-06-30 ENCOUNTER — Other Ambulatory Visit (HOSPITAL_COMMUNITY): Payer: Self-pay | Admitting: Internal Medicine

## 2021-06-30 DIAGNOSIS — Z0000 Encounter for general adult medical examination without abnormal findings: Secondary | ICD-10-CM | POA: Diagnosis not present

## 2021-06-30 DIAGNOSIS — Z6824 Body mass index (BMI) 24.0-24.9, adult: Secondary | ICD-10-CM | POA: Diagnosis not present

## 2021-06-30 DIAGNOSIS — Z780 Asymptomatic menopausal state: Secondary | ICD-10-CM

## 2021-06-30 DIAGNOSIS — R7301 Impaired fasting glucose: Secondary | ICD-10-CM | POA: Diagnosis not present

## 2021-06-30 DIAGNOSIS — M8580 Other specified disorders of bone density and structure, unspecified site: Secondary | ICD-10-CM

## 2021-06-30 DIAGNOSIS — E785 Hyperlipidemia, unspecified: Secondary | ICD-10-CM | POA: Diagnosis not present

## 2021-06-30 DIAGNOSIS — M859 Disorder of bone density and structure, unspecified: Secondary | ICD-10-CM | POA: Diagnosis not present

## 2021-06-30 DIAGNOSIS — I10 Essential (primary) hypertension: Secondary | ICD-10-CM | POA: Diagnosis not present

## 2021-07-07 ENCOUNTER — Ambulatory Visit (HOSPITAL_COMMUNITY)
Admission: RE | Admit: 2021-07-07 | Discharge: 2021-07-07 | Disposition: A | Discharge status: Home / Self Care | Payer: Medicare Other | Source: Ambulatory Visit | Attending: Internal Medicine | Admitting: Internal Medicine

## 2021-07-07 ENCOUNTER — Other Ambulatory Visit: Payer: Self-pay

## 2021-07-07 DIAGNOSIS — M8589 Other specified disorders of bone density and structure, multiple sites: Secondary | ICD-10-CM | POA: Diagnosis not present

## 2021-07-07 DIAGNOSIS — Z780 Asymptomatic menopausal state: Secondary | ICD-10-CM | POA: Diagnosis not present

## 2021-07-19 DIAGNOSIS — T148XXA Other injury of unspecified body region, initial encounter: Secondary | ICD-10-CM | POA: Diagnosis not present

## 2021-08-25 ENCOUNTER — Other Ambulatory Visit (HOSPITAL_COMMUNITY): Payer: Self-pay | Admitting: Internal Medicine

## 2021-08-25 DIAGNOSIS — Z1231 Encounter for screening mammogram for malignant neoplasm of breast: Secondary | ICD-10-CM

## 2021-09-22 ENCOUNTER — Ambulatory Visit (HOSPITAL_COMMUNITY): Payer: TRICARE For Life (TFL)

## 2021-09-23 ENCOUNTER — Ambulatory Visit (HOSPITAL_COMMUNITY)
Admission: RE | Admit: 2021-09-23 | Discharge: 2021-09-23 | Disposition: A | Discharge status: Home / Self Care | Payer: Medicare Other | Source: Ambulatory Visit | Attending: Internal Medicine | Admitting: Internal Medicine

## 2021-09-23 ENCOUNTER — Other Ambulatory Visit: Payer: Self-pay

## 2021-09-23 DIAGNOSIS — Z1231 Encounter for screening mammogram for malignant neoplasm of breast: Secondary | ICD-10-CM | POA: Insufficient documentation

## 2021-09-26 ENCOUNTER — Other Ambulatory Visit (HOSPITAL_COMMUNITY): Payer: Self-pay | Admitting: Internal Medicine

## 2021-09-26 DIAGNOSIS — R928 Other abnormal and inconclusive findings on diagnostic imaging of breast: Secondary | ICD-10-CM

## 2021-09-30 DIAGNOSIS — L720 Epidermal cyst: Secondary | ICD-10-CM | POA: Diagnosis not present

## 2021-10-05 ENCOUNTER — Other Ambulatory Visit: Payer: Self-pay

## 2021-10-05 ENCOUNTER — Ambulatory Visit (INDEPENDENT_AMBULATORY_CARE_PROVIDER_SITE_OTHER): Payer: Medicare Other | Admitting: Plastic Surgery

## 2021-10-05 ENCOUNTER — Encounter: Payer: Self-pay | Admitting: Plastic Surgery

## 2021-10-05 VITALS — BP 175/69 | HR 67 | Ht 62.0 in | Wt 130.0 lb

## 2021-10-05 DIAGNOSIS — L723 Sebaceous cyst: Secondary | ICD-10-CM | POA: Diagnosis not present

## 2021-10-05 NOTE — Progress Notes (Signed)
Referring Provider Asencion Noble, MD [address] [place],  [place] [address]   CC:  Chief Complaint  Patient presents with   Consult      [name] is an 71 y.o. female.  HPI: Patient presents for a cyst on her anterior neck.  Is been present for a number of years.  It is growing in size.  She is interested in having it removed.  She was sent by her dermatologist.  No Known Allergies  Outpatient Encounter Medications as of 10/05/2021  Medication Sig   acetaminophen (TYLENOL) 500 MG tablet Take 500 mg by mouth as needed for mild pain. Only as needed   Calcium Carbonate-Vitamin D (CALCIUM 600 + D PO) Take 1 tablet by mouth daily.   cetirizine (ZYRTEC) 10 MG tablet Take 10 mg by mouth daily as needed for allergies. As needed   Multiple Vitamins-Minerals (ONE-A-DAY WOMENS 50 PLUS PO) Take 1 tablet by mouth daily.   valsartan (DIOVAN) 80 MG tablet Take 80 mg by mouth at bedtime.   No facility-administered encounter medications on file as of 10/05/2021.     Past Medical History:  Diagnosis Date   Abnormal Pap smear    Arthritis    Colon polyps    Hx of seasonal allergies    Hypertension    Postmenopausal HRT (hormone replacement therapy) 11/14/2012   Vaginal Pap smear, abnormal     Past Surgical History:  Procedure Laterality Date   COLONOSCOPY N/A 04/15/2014   Procedure: COLONOSCOPY;  Surgeon: Daneil Dolin, MD;  [facility];  Service: Endoscopy;  Laterality: N/A;  9:30 AM   COLONOSCOPY N/A 06/06/2017   Procedure: COLONOSCOPY;  Surgeon: Daneil Dolin, MD;  [facility];  Service: Endoscopy;  Laterality: N/A;  8:30 AM   COLONOSCOPY WITH PROPOFOL N/A 01/13/2021   Procedure: COLONOSCOPY WITH PROPOFOL;  Surgeon: Daneil Dolin, MD;  [facility];  Service: Endoscopy;  Laterality: N/A;  am   LT KNEE SURGERY     POLYPECTOMY  06/06/2017   Procedure: POLYPECTOMY;  Surgeon: Daneil Dolin, MD;  [facility];  Service:  Endoscopy;;  Ascending colon x2 (HS)   POLYPECTOMY  01/13/2021   Procedure: POLYPECTOMY INTESTINAL;  Surgeon: Daneil Dolin, MD;  [facility];  Service: Endoscopy;;   SHOULDER SURGERY     thumb surger     WRIST SURGERY      Family History  Problem Relation Age of Onset   Diabetes Mother    Colon cancer Mother 44   Colon cancer Sister 26   Diabetes Maternal Aunt    Depression Sister    Arthritis Sister     Social History   Social History Narrative   Not on file     Review of Systems General: Denies fevers, chills, weight loss CV: Denies chest pain, shortness of breath, palpitations  Physical Exam Vitals with BMI 10/05/2021 01/13/2021 01/13/2021  Height 5\' 2"  - 5\' 2"   Weight 130 lbs - 130 lbs  BMI 84.16 - 60.63  Systolic 016 010 932  Diastolic 69 57 75  Pulse 67 77 -    General:  No acute distress,  Alert and oriented, Non-Toxic, Normal speech and affect Examination shows a 1.5 to 2 cm cystic lesion in the anterior neck near the midline.  It feels mobile.  No surrounding skin changes or scars.  Assessment/Plan Patient presents with a cystic nodule in the anterior neck.  We  discussed excision.  We discussed risks include bleeding, infection, damage to surrounding structures need for additional procedures.  All of her questions were answered and she is interested in moving forward with local excision.  Cindra Presume 10/05/2021, 2:04 PM

## 2021-10-11 ENCOUNTER — Ambulatory Visit (HOSPITAL_COMMUNITY)
Admission: RE | Admit: 2021-10-11 | Discharge: 2021-10-11 | Disposition: A | Discharge status: Home / Self Care | Payer: Medicare Other | Source: Ambulatory Visit | Attending: Internal Medicine | Admitting: Internal Medicine

## 2021-10-11 ENCOUNTER — Other Ambulatory Visit (HOSPITAL_COMMUNITY): Payer: Self-pay | Admitting: Internal Medicine

## 2021-10-11 ENCOUNTER — Other Ambulatory Visit: Payer: Self-pay

## 2021-10-11 DIAGNOSIS — R922 Inconclusive mammogram: Secondary | ICD-10-CM | POA: Diagnosis not present

## 2021-10-11 DIAGNOSIS — R928 Other abnormal and inconclusive findings on diagnostic imaging of breast: Secondary | ICD-10-CM

## 2021-10-18 ENCOUNTER — Other Ambulatory Visit (HOSPITAL_COMMUNITY): Payer: Self-pay | Admitting: Internal Medicine

## 2021-10-18 ENCOUNTER — Other Ambulatory Visit: Payer: Self-pay

## 2021-10-18 ENCOUNTER — Encounter (HOSPITAL_COMMUNITY): Payer: Self-pay

## 2021-10-18 ENCOUNTER — Ambulatory Visit (HOSPITAL_COMMUNITY)
Admission: RE | Admit: 2021-10-18 | Discharge: 2021-10-18 | Disposition: A | Discharge status: Home / Self Care | Payer: Medicare Other | Source: Ambulatory Visit | Attending: Internal Medicine | Admitting: Internal Medicine

## 2021-10-18 DIAGNOSIS — R928 Other abnormal and inconclusive findings on diagnostic imaging of breast: Secondary | ICD-10-CM

## 2021-10-18 DIAGNOSIS — R590 Localized enlarged lymph nodes: Secondary | ICD-10-CM | POA: Diagnosis not present

## 2021-10-18 DIAGNOSIS — N6312 Unspecified lump in the right breast, upper inner quadrant: Secondary | ICD-10-CM | POA: Diagnosis not present

## 2021-10-18 DIAGNOSIS — C50211 Malignant neoplasm of upper-inner quadrant of right female breast: Secondary | ICD-10-CM | POA: Diagnosis not present

## 2021-10-18 MED ORDER — LIDOCAINE HCL (PF) 2 % IJ SOLN
INTRAMUSCULAR | Status: AC
  Administered 2021-10-18: 10 mL
  Filled 2021-10-18: qty 10

## 2021-10-18 MED ORDER — LIDOCAINE-EPINEPHRINE (PF) 1 %-1:200000 IJ SOLN
INTRAMUSCULAR | Status: AC
  Administered 2021-10-18: 10 mL
  Filled 2021-10-18: qty 30

## 2021-10-18 NOTE — Progress Notes (Signed)
PT tolerated right breast and right axilla biopsy well today with NAD noted. PT verbalized understanding of discharge instructions. PT ambulated back to the mammogram area this time and given ice packs.

## 2021-10-19 ENCOUNTER — Encounter: Payer: Self-pay | Admitting: Plastic Surgery

## 2021-10-19 ENCOUNTER — Other Ambulatory Visit (HOSPITAL_COMMUNITY)
Admission: RE | Admit: 2021-10-19 | Discharge: 2021-10-19 | Disposition: A | Discharge status: Home / Self Care | Payer: Medicare Other | Source: Ambulatory Visit | Attending: Plastic Surgery | Admitting: Plastic Surgery

## 2021-10-19 ENCOUNTER — Ambulatory Visit (INDEPENDENT_AMBULATORY_CARE_PROVIDER_SITE_OTHER): Payer: Medicare Other | Admitting: Plastic Surgery

## 2021-10-19 VITALS — BP 180/77 | HR 70

## 2021-10-19 DIAGNOSIS — L723 Sebaceous cyst: Secondary | ICD-10-CM | POA: Diagnosis not present

## 2021-10-19 DIAGNOSIS — L988 Other specified disorders of the skin and subcutaneous tissue: Secondary | ICD-10-CM | POA: Diagnosis not present

## 2021-10-19 NOTE — Progress Notes (Signed)
Operative Note  ? ?DATE OF OPERATION: 10/19/2021 ? ?LOCATION:   ? ?SURGICAL DEPARTMENT: Plastic Surgery ? ?PREOPERATIVE DIAGNOSES: Anterior neck skin lesion ? ?POSTOPERATIVE DIAGNOSES:  same ? ?PROCEDURE:  ?Excision of anterior neck skin lesion measuring 3.5 cm ?Complex closure measuring 3.5 cm ? ?SURGEON: Talmadge Coventry, MD ? ?ANESTHESIA:  Local ? ?COMPLICATIONS: None.  ? ?INDICATIONS FOR PROCEDURE:  ?The patient, [name] is a 71 y.o. female born on 1951/01/06, is here for treatment of anterior neck skin lesion ?MRN: [id number] ? ?CONSENT:  ?Informed consent was obtained directly from the patient. Risks, benefits and alternatives were fully discussed. Specific risks including but not limited to bleeding, infection, hematoma, seroma, scarring, pain, infection, wound healing problems, and need for further surgery were all discussed. The patient did have an ample opportunity to have questions answered to satisfaction.  ? ?DESCRIPTION OF PROCEDURE:  ?Local anesthesia was administered. The patient's operative site was prepped and draped in a sterile fashion. A time out was performed and all information was confirmed to be correct.  The lesion was excised with a 15 blade.  Hemostasis was obtained.  Circumferential undermining was performed and the skin was advanced and closed in layers with interrupted buried Monocryl sutures and 5-0 fast gut for the skin.  The lesion excised measured 3.5 cm, and the total length of closure measured 3.5 cm.   ? ?The patient tolerated the procedure well.  There were no complications. ?  ? ?

## 2021-10-21 LAB — SURGICAL PATHOLOGY

## 2021-10-24 LAB — SURGICAL PATHOLOGY

## 2021-10-31 ENCOUNTER — Ambulatory Visit (INDEPENDENT_AMBULATORY_CARE_PROVIDER_SITE_OTHER): Payer: Medicare Other | Admitting: Surgical

## 2021-10-31 ENCOUNTER — Other Ambulatory Visit: Payer: Self-pay

## 2021-10-31 DIAGNOSIS — L723 Sebaceous cyst: Secondary | ICD-10-CM

## 2021-10-31 NOTE — Progress Notes (Signed)
71 year old female here for follow-up after excision of sebaceous cyst of her anterior neck, pathology was consistent with an acutely inflamed ruptured epidermal inclusion cyst.  Patient is doing well.  She is approximately 2 weeks post procedure. ? ?The 5-0 fast gut has dissolved.  On exam the incision is intact and well-healed.  There is no erythema or cellulitic changes.  No subcutaneous fluid collection noted. ? ?Discussed use of scar creams, sunscreen. ?Recommend following up as needed. ?Pictures were obtained of the patient and placed in the chart with the patient's or guardian's permission. ? ?

## 2021-11-02 ENCOUNTER — Ambulatory Visit: Payer: Medicare Other | Admitting: Plastic Surgery

## 2021-11-18 ENCOUNTER — Telehealth: Payer: Self-pay | Admitting: Radiation Oncology

## 2021-11-18 DIAGNOSIS — I10 Essential (primary) hypertension: Secondary | ICD-10-CM | POA: Insufficient documentation

## 2021-11-18 DIAGNOSIS — C50211 Malignant neoplasm of upper-inner quadrant of right female breast: Secondary | ICD-10-CM | POA: Diagnosis not present

## 2021-11-18 DIAGNOSIS — Z170 Estrogen receptor positive status [ER+]: Secondary | ICD-10-CM | POA: Diagnosis not present

## 2021-11-18 NOTE — Telephone Encounter (Signed)
Called patient on mobile/home line to schedule a consultation w. Dr. Sondra Come. No answer, LVM for a return call.  ?

## 2021-11-21 ENCOUNTER — Telehealth: Payer: Self-pay | Admitting: Hematology and Oncology

## 2021-11-21 NOTE — Telephone Encounter (Signed)
Scheduled new patient appointment per [facility] and [place]. Patient aware of appointment time.  ?

## 2021-11-21 NOTE — Progress Notes (Signed)
Location of Breast Cancer: upper-inner quadrant of right breast  ? ?Histology per Pathology Report:  ?A. BREAST, RIGHT, 1 OCLOCK, BIOPSY:  ?- Invasive lobular carcinoma, grade 1/2.  ?- See comment.  ? ?B. LYMPH NODE, RIGHT AXILLA, BIOPSY:  ?- Lymph node with hemosiderin deposition.  ?- No metastatic carcinoma identified.  ?- See comment.  ? ?Receptor Status: This is 60% ER positive, PR positive at 50%, HER2 negative, and Ki-67 is 2% ? ?Did patient present with symptoms (if so, please note symptoms) or was this found on screening mammography?: screening mammogram ? ?Past/Anticipated interventions by surgeon, if any:  ?Right breast seed guided lumpectomy, right axillary sn biopsy ? ?Past/Anticipated interventions by medical oncology, if any: Chemotherapy pending referral to medical oncology ? ?Lymphedema issues, if any:  no   ? ?Pain issues, if any:  no  ? ?SAFETY ISSUES: ?Prior radiation? no ?Pacemaker/ICD? no ?Possible current pregnancy?no, postmenopausal ?Is the patient on methotrexate? no ? ?Current Complaints / other details:  nothing of note ?   ?Vitals:  ? 11/24/21 1320  ?BP: (!) 144/67  ?Pulse: 70  ?Resp: 18  ?Temp: 98 ?F (36.7 ?C)  ?SpO2: 99%  ?Weight: 132 lb (59.9 kg)  ?Height: _0  (1.575 m)  ? ? ? ? ?

## 2021-11-22 ENCOUNTER — Other Ambulatory Visit: Payer: Self-pay | Admitting: *Deleted

## 2021-11-22 ENCOUNTER — Telehealth: Payer: Self-pay | Admitting: Hematology and Oncology

## 2021-11-22 DIAGNOSIS — C50919 Malignant neoplasm of unspecified site of unspecified female breast: Secondary | ICD-10-CM

## 2021-11-22 DIAGNOSIS — C50911 Malignant neoplasm of unspecified site of right female breast: Secondary | ICD-10-CM | POA: Insufficient documentation

## 2021-11-22 NOTE — Telephone Encounter (Signed)
Contacted patient to see if she wanted to keep her new patient appointment with [facility], Since she is scheduled as a new patient at [facility]. Left message for patient to call back.  ?

## 2021-11-22 NOTE — Telephone Encounter (Signed)
Pt called in requesting to cancel her appt with Dr. Chryl Heck on 4/10. She stated that she will be seen at [facility] instead. Appt cancelled per pt request.  ?

## 2021-11-23 ENCOUNTER — Other Ambulatory Visit: Payer: Self-pay | Admitting: General Surgery

## 2021-11-23 ENCOUNTER — Encounter (HOSPITAL_COMMUNITY): Payer: Self-pay | Admitting: Hematology

## 2021-11-23 ENCOUNTER — Inpatient Hospital Stay (HOSPITAL_COMMUNITY): Payer: Medicare Other | Attending: Hematology | Admitting: Hematology

## 2021-11-23 DIAGNOSIS — C50211 Malignant neoplasm of upper-inner quadrant of right female breast: Secondary | ICD-10-CM | POA: Diagnosis not present

## 2021-11-23 DIAGNOSIS — Z800 Family history of malignant neoplasm of digestive organs: Secondary | ICD-10-CM | POA: Diagnosis not present

## 2021-11-23 DIAGNOSIS — C50911 Malignant neoplasm of unspecified site of right female breast: Secondary | ICD-10-CM

## 2021-11-23 DIAGNOSIS — Z170 Estrogen receptor positive status [ER+]: Secondary | ICD-10-CM | POA: Diagnosis not present

## 2021-11-23 NOTE — Progress Notes (Signed)
? ?[facility] ?[address] ?[place], [place] [address] ? ? ?Patient Care Team: ?Asencion Noble, MD as PCP - General ?Gala Romney Cristopher Estimable, MD as Consulting Physician (Gastroenterology) ?Derek Jack, MD as Medical Oncologist (Medical Oncology) ?Brien Mates, RN as Oncology Nurse Navigator (Medical Oncology) ? ?CHIEF COMPLAINTS/PURPOSE OF CONSULTATION:  ?Newly diagnosed right breast cancer ? ?HISTORY OF PRESENTING ILLNESS:  ?[name] 71 y.o. female is here because of recent diagnosis of right breast cancer.  ? ?Today she reports feeling good, and she is accompanied by her husband. She has been receiving regular screening mammograms. She denies being able to palpate any mass in her breast. She denies history of breast biopsies. She denies history of CVA, MI, PE, and DVT. She denies numbness/tingling and ankle swellings.  ? 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 Her mother and sister had colon cancer. She denies smoking history.  ? ?In terms of breast cancer risk profile:  ?She menarched at early age of 75 and went to menopause at age 51  ?She had 3 pregnancies, her first child was born at age 2  ?She received birth control pills for approximately 6 years.  ?She was exposed to fertility medications or hormone replacement therapy for 1 year.  ?She has family history of Breast/GYN/GI cancer ? ?I reviewed her records extensively and collaborated the history with the patient. ? ?SUMMARY OF ONCOLOGIC HISTORY: ?Oncology History  ? No history exists.  ? ? ?MEDICAL HISTORY:  ?Past Medical History:  ?Diagnosis Date  ? Abnormal Pap smear   ? Arthritis   ? Colon polyps   ? Hx of seasonal allergies   ? Hypertension   ? Postmenopausal HRT (hormone replacement therapy) 11/14/2012  ? Vaginal Pap smear, abnormal   ? ? ?SURGICAL HISTORY: ?Past Surgical History:  ?Procedure Laterality Date  ? COLONOSCOPY N/A 04/15/2014  ? Procedure: COLONOSCOPY;  Surgeon: Daneil Dolin, MD;  [facility];  Service: Endoscopy;  Laterality: N/A;  9:30 AM  ? COLONOSCOPY N/A 06/06/2017  ? Procedure: COLONOSCOPY;  Surgeon: Daneil Dolin, MD;  [facility];  Service: Endoscopy;  Laterality: N/A;  8:30 AM  ? COLONOSCOPY WITH PROPOFOL N/A 01/13/2021  ? Procedure: COLONOSCOPY WITH PROPOFOL;  Surgeon: Daneil Dolin, MD;  [facility];  Service: Endoscopy;  Laterality: N/A;  am  ? LT KNEE SURGERY    ? POLYPECTOMY  06/06/2017  ? Procedure: POLYPECTOMY;  Surgeon: Daneil Dolin, MD;  [facility];  Service: Endoscopy;;  Ascending colon x2 (HS)  ? POLYPECTOMY  01/13/2021  ? Procedure: POLYPECTOMY INTESTINAL;  Surgeon: Daneil Dolin, MD;  [facility];  Service: Endoscopy;;  ? SHOULDER SURGERY    ? thumb surger    ? WRIST SURGERY    ? ? ?SOCIAL HISTORY: ?Social History  ? ?Socioeconomic History  ? Marital status: Married  ?  Spouse name: Not on file  ? Number of children: Not on file  ? Years of education: Not on file  ? Highest education level: Not on file  ?Occupational History  ? Not on file  ?Tobacco Use  ? Smoking status: Never  ? Smokeless tobacco: Never  ?Vaping Use  ? Vaping Use: Never used  ?Substance and Sexual Activity  ? Alcohol use: No  ?  Comment: occ  ? Drug use: No  ? Sexual activity: Yes  ?  Birth control/protection: None, Post-menopausal  ?Other Topics  Concern  ? Not on file  ?Social History Narrative  ? Not on file  ? ?Social Determinants of Health  ? ?Financial Resource Strain: Not on file  ?Food Insecurity: Not on file  ?Transportation Needs: Not on file  ?Physical Activity: Not on file  ?Stress: Not on file  ?Social Connections: Not on file  ?Intimate Partner Violence: Not on file  ? ? ?FAMILY HISTORY: ?Family History  ?Problem Relation Age of Onset  ? Diabetes Mother   ? Colon cancer Mother 37  ? Colon cancer Sister 53  ? Diabetes Maternal Aunt   ? Depression Sister   ? Arthritis Sister   ? ? ?ALLERGIES:  has No Known Allergies. ? ?MEDICATIONS:  ?Current  Outpatient Medications  ?Medication Sig Dispense Refill  ? acetaminophen (TYLENOL) 500 MG tablet Take 500 mg by mouth as needed for mild pain. Only as needed    ? Calcium Carbonate-Vitamin D (CALCIUM 600 + D PO) Take 1 tablet by mouth daily.    ? cetirizine (ZYRTEC) 10 MG tablet Take 10 mg by mouth daily as needed for allergies. As needed    ? Multiple Vitamins-Minerals (ONE-A-DAY WOMENS 50 PLUS PO) Take 1 tablet by mouth daily.    ? valsartan (DIOVAN) 80 MG tablet Take 80 mg by mouth at bedtime.    ? ?No current facility-administered medications for this visit.  ? ? ?REVIEW OF SYSTEMS:   ?Review of Systems  ?Constitutional:  Negative for appetite change and fatigue.  ?Cardiovascular:  Negative for leg swelling.  ?Neurological:  Negative for numbness.  ?All other systems reviewed and are negative. ? ?PHYSICAL EXAMINATION: ?ECOG PERFORMANCE STATUS: 1 - Symptomatic but completely ambulatory ? ?Vitals:  ? 11/23/21 0811  ?BP: (!) 128/55  ?Pulse: 71  ?Resp: 16  ?Temp: (!) 96.8 ?F (36 ?C)  ?SpO2: 100%  ? ?Filed Weights  ? 11/23/21 0811  ?Weight: 130 lb 8.2 oz (59.2 kg)  ? ?Physical Exam ?Vitals reviewed.  ?Constitutional:   ?   Appearance: Normal appearance.  ?Cardiovascular:  ?   Rate and Rhythm: Normal rate and regular rhythm.  ?   Pulses: Normal pulses.  ?   Heart sounds: Normal heart sounds.  ?Pulmonary:  ?   Effort: Pulmonary effort is normal.  ?   Breath sounds: Normal breath sounds.  ?Chest:  ?Breasts: ?   Right: No swelling, bleeding, inverted nipple, mass, nipple discharge, skin change or tenderness.  ?   Left: No swelling, bleeding, inverted nipple, mass, nipple discharge, skin change or tenderness.  ?Lymphadenopathy:  ?   Upper Body:  ?   Right upper body: No supraclavicular or axillary adenopathy.  ?   Left upper body: No supraclavicular or axillary adenopathy.  ?Neurological:  ?   General: No focal deficit present.  ?   Mental Status: She is alert and oriented to person, place, and time.  ?Psychiatric:     ?    Mood and Affect: Mood normal.     ?   Behavior: Behavior normal.  ? ? ?Breast Exam Chaperone: Thana Ates   ? ?LABORATORY DATA:  ?I have reviewed the data as listed ? ? ?RADIOGRAPHIC STUDIES: ?I have personally reviewed the radiological reports and agreed with the findings in the report. ?No results found.  ? ?ASSESSMENT:  ?Stage Ia (T1BN0G1) right invasive lobular breast cancer, ER/PR positive, HER2 negative: ?- Screening mammogram on 09/23/2021 with distortion in the right breast. ?- Diagnostic mammogram on 10/11/2021 with suspicious right breast mass with spiculated margins  at 1 o'clock position measuring 8 x 8 x 5 mm.  Right axilla demonstrates few borderline lymph nodes with nodular areas of cortical thickening up to 4-5 mm. ?- Right breast 1:00 biopsy and right axillary lymph node biopsy on 10/18/2021 ?- Pathology: Invasive lobular carcinoma, grade 1, E-cadherin negative.  HER2 0, ER 60%, PR 50%, Ki-67 2%.  Right axilla lymph node with no metastatic carcinoma identified. ?- She was evaluated by Dr. Donne Hazel. ? ?Social/history: ?- Lives at home with her husband.  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  Non-smoker. ?- Mother and sister had colon cancers. ? ? ?PLAN:  ?Stage Ia (T1BN0G1) right breast ILC, ER/PR positive, HER2 negative: ?- We have discussed her scans, biopsy report in detail. ?- We discussed normal treatment paradigm of breast cancer with surgery followed by antiestrogen therapy and XRT if needed. ?- She will proceed with lumpectomy and SLNB with Dr. Donne Hazel on 12/12/2021. ?- Also recommended genetic testing. ?- RTC 4 weeks after surgery for follow-up. ? ? ? ?Derek Jack, MD 11/23/21 8:35 AM  ?[facility] ?[phone] ? ? ?I, Thana Ates, am acting as a scribe for Dr. Derek Jack. ? ?I, Derek Jack MD, have reviewed the above documentation for accuracy and completeness, and I agree with the above. ?  ? ?

## 2021-11-23 NOTE — Patient Instructions (Addendum)
[facility] at [facility] ?Discharge Instructions ? ?You were seen and examined today by Dr. Delton Coombes. Dr. Delton Coombes is a medical oncologist, meaning that he specializes in the treatment of cancer diagnoses. Dr. Delton Coombes discussed your past medical history, family history of cancers, and the events that led to you being here today. ? ?You were referred to Dr. Delton Coombes due to your recent diagnosis of Stage I Hormone Receptor (estrogen/progesterone) positive Invasive Lobular Breast Cancer. Dr. Delton Coombes has recommended proceeding with surgery. ? ?Following surgery, the goal will be to prevent the cancer from returning. This will most likely be done via a pill to be taken once daily for a total of 5 years. ? ?Follow-up with Dr. Delton Coombes approximately 4 weeks after surgery. ? ? ?Thank you for choosing [facility] at [facility] to provide your oncology and hematology care.  To afford each patient quality time with our provider, please arrive at least 15 minutes before your scheduled appointment time.  ? ?If you have a lab appointment with the [facility] please come in thru the Main Entrance and check in at the main information desk. ? ?You need to re-schedule your appointment should you arrive 10 or more minutes late.  We strive to give you quality time with our providers, and arriving late affects you and other patients whose appointments are after yours.  Also, if you no show three or more times for appointments you may be dismissed from the clinic at the providers discretion.     ?Again, thank you for choosing [facility].  Our hope is that these requests will decrease the amount of time that you wait before being seen by our physicians.       ?_____________________________________________________________ ? ?Should you have questions after your visit to [facility], please contact our office at [phone] and follow the prompts.   Our office hours are 8:00 a.m. and 4:30 p.m. Monday - Friday.  Please note that voicemails left after 4:00 p.m. may not be returned until the following business day.  We are closed weekends and major holidays.  You do have access to a nurse 24-7, just call the main number to the clinic [phone] and do not press any options, hold on the line and a nurse will answer the phone.   ? ?For prescription refill requests, have your pharmacy contact our office and allow 72 hours.   ? ?Due to Covid, you will need to wear a mask upon entering the hospital. If you do not have a mask, a mask will be given to you at the Main Entrance upon arrival. For doctor visits, patients may have 1 support person age 14 or older with them. For treatment visits, patients can not have anyone with them due to social distancing guidelines and our immunocompromised population.  ? ? ? ?

## 2021-11-23 NOTE — Therapy (Signed)
?OUTPATIENT PHYSICAL THERAPY BREAST CANCER BASELINE EVALUATION ? ? ?Patient Name: [name] ?MRN: [id number] ?DOB:11/12/1950, 71 y.o., female ?Today's Date: 11/24/2021 ? ? PT End of Session - 11/24/21 1137   ? ? Visit Number 1   ? Number of Visits 2   ? Date for PT Re-Evaluation 01/07/22   ? PT Start Time 1100   ? PT Stop Time 1125   ? PT Time Calculation (min) 25 min   ? Activity Tolerance Patient tolerated treatment well   ? Behavior During Therapy [facility] for tasks assessed/performed   ? ?  ?  ? ?  ? ? ?Past Medical History:  ?Diagnosis Date  ? Abnormal Pap smear   ? Arthritis   ? Colon polyps   ? Hx of seasonal allergies   ? Hypertension   ? Postmenopausal HRT (hormone replacement therapy) 11/14/2012  ? Vaginal Pap smear, abnormal   ? ?Past Surgical History:  ?Procedure Laterality Date  ? COLONOSCOPY N/A 04/15/2014  ? Procedure: COLONOSCOPY;  Surgeon: Daneil Dolin, MD;  [facility];  Service: Endoscopy;  Laterality: N/A;  9:30 AM  ? COLONOSCOPY N/A 06/06/2017  ? Procedure: COLONOSCOPY;  Surgeon: Daneil Dolin, MD;  [facility];  Service: Endoscopy;  Laterality: N/A;  8:30 AM  ? COLONOSCOPY WITH PROPOFOL N/A 01/13/2021  ? Procedure: COLONOSCOPY WITH PROPOFOL;  Surgeon: Daneil Dolin, MD;  [facility];  Service: Endoscopy;  Laterality: N/A;  am  ? LT KNEE SURGERY    ? POLYPECTOMY  06/06/2017  ? Procedure: POLYPECTOMY;  Surgeon: Daneil Dolin, MD;  [facility];  Service: Endoscopy;;  Ascending colon x2 (HS)  ? POLYPECTOMY  01/13/2021  ? Procedure: POLYPECTOMY INTESTINAL;  Surgeon: Daneil Dolin, MD;  [facility];  Service: Endoscopy;;  ? SHOULDER SURGERY    ? thumb surger    ? WRIST SURGERY    ? ?Patient Active Problem List  ? Diagnosis Date Noted  ? Invasive lobular carcinoma of right breast in female [facility]) 11/22/2021  ? 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 Rectocele 11/27/2016  ? Postmenopausal HRT (hormone replacement therapy) 11/14/2012   ? Arthritis 11/14/2012  ? ? ?PCP: Asencion Noble, MD ? ?REFERRING PROVIDER: Rolm Bookbinder, MD ? ?REFERRING DIAG: Rt breast cancer ? ?THERAPY DIAG:  ?Invasive lobular carcinoma of right breast in female [facility]) ? ?Abnormal posture ? ?ONSET DATE: 11/19/21 ? ?SUBJECTIVE                                                                                                                                                                                          ? ?  SUBJECTIVE STATEMENT: ?Patient reports she is here today to be seen by her medical team for her newly diagnosed right breast cancer.  ? ?PERTINENT HISTORY:  ?Patient was diagnosed with Stage Ia invasive lobular breast cancer. ER/PR positive, HER2 negative. Will be having lumpectomy and SLNB with Dr. Donne Hazel on 12/12/21 followed by XRT and antiestrogen therapy.  ? ?PATIENT GOALS   reduce lymphedema risk and learn post op HEP.  ? ?PAIN:  ?Are you having pain? No ? ? ?PRECAUTIONS: Active CA  ? ?HAND DOMINANCE: right ? ?WEIGHT BEARING RESTRICTIONS No ? ?FALLS:  ?Has patient fallen in last 6 months? No ? ?LIVING ENVIRONMENT: ?Patient lives with: spouse ? 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 ?LEISURE: not much  ? ?PRIOR LEVEL OF FUNCTION: Independent ? ? ?OBJECTIVE ? ?COGNITION: ? Overall cognitive status: Within functional limits for tasks assessed   ? ?POSTURE:  ?Forward head and rounded shoulders posture ? ?UPPER EXTREMITY AROM/PROM: ? ?A/PROM RIGHT  11/24/2021 ?  ?Shoulder extension 60  ?Shoulder flexion 150  ?Shoulder abduction 150  ?Shoulder internal rotation   ?Shoulder external rotation 90  ?  (Blank rows = not tested) ? ?A/PROM LEFT  11/24/2021  ?Shoulder extension   ?Shoulder flexion   ?Shoulder abduction   ?Shoulder internal rotation   ?Shoulder external rotation   ?  (Blank rows = not tested) ? ? ?LYMPHEDEMA ASSESSMENTS:  ? ?[place] RIGHT  11/24/2021  ?10 cm proximal to olecranon process 27  ?Olecranon process 24.5  ?10 cm proximal to ulnar styloid process  20.5  ?Just proximal to ulnar styloid process 14.5  ?Across hand at thumb web space 28  ?At base of 2nd digit 6.6  ?(Blank rows = not tested) ? ?[place] LEFT  11/24/2021  ?10 cm proximal to olecranon process   ?Olecranon process   ?10 cm proximal to ulnar styloid process   ?Just proximal to ulnar styloid process   ?Across hand at thumb web space   ?At base of 2nd digit   ?(Blank rows = not tested) ? ? ?L-DEX LYMPHEDEMA SCREENING: ? ?The patient was assessed using the L-Dex machine today to produce a lymphedema index baseline score. The patient will be reassessed on a regular basis (typically every 3 months) to obtain new L-Dex scores. If the score is > 6.5 points away from his/her baseline score indicating onset of subclinical lymphedema, it will be recommended to wear a compression garment for 4 weeks, 12 hours per day and then be reassessed. If the score continues to be > 6.5 points from baseline at reassessment, we will initiate lymphedema treatment. Assessing in this manner has a 95% rate of preventing clinically significant lymphedema. ? ? L-DEX FLOWSHEETS - 11/24/21 1100   ? ?  ? L-DEX LYMPHEDEMA SCREENING  ? Measurement Type Unilateral   ? L-DEX MEASUREMENT EXTREMITY Upper Extremity   ? POSITION  Standing   ? DOMINANT SIDE Right   ? At Risk Side Right   ? BASELINE SCORE (UNILATERAL) 3.3   ? ?  ?  ? ?  ? ? ? ?QUICK DASH SURVEY:4.5% ? ? ?PATIENT EDUCATION:  ?Education details: Lymphedema risk reduction and post op shoulder/posture HEP ?Person educated: Patient ?Education method: Explanation, Demonstration, Handout ?Education comprehension: Patient verbalized understanding and returned demonstration ? ? ?HOME EXERCISE PROGRAM: ?Patient was instructed today in a home exercise program today for post op shoulder range of motion. These included active assist shoulder flexion in sitting, scapular retraction, wall walking with shoulder abduction, and hands behind  head external rotation.  She was encouraged to do these  twice a day, holding 3 seconds and repeating 5 times when permitted by her physician. ? ? ?ASSESSMENT: ? ?CLINICAL IMPRESSION: ?Pt will benefit from a post op PT reassessment to determine needs and from L-Dex screens every 3 months for 2 years to detect subclinical lymphedema. ? ?Pt will benefit from skilled therapeutic intervention to improve on the following deficits: Decreased knowledge of precautions, impaired UE functional use, pain, decreased ROM, postural dysfunction.  ? ?PT treatment/interventions: ADL/self-care home management, pt/family education, therapeutic exercise ? ?REHAB POTENTIAL: Excellent ? ?CLINICAL DECISION MAKING: Stable/uncomplicated ? ?EVALUATION COMPLEXITY: Low ? ? ?GOALS: ?Goals reviewed with patient? YES ? ?LONG TERM GOALS: (STG=LTG) ? ? Name Target Date Goal status  ?1 Pt will be able to verbalize understanding of pertinent lymphedema risk reduction practices relevant to her dx specifically related to skin care.  ?Baseline:  No knowledge 11/24/2021 Achieved at eval  ?2 Pt will be able to return demo and/or verbalize understanding of the post op HEP related to regaining shoulder ROM. ?Baseline:  No knowledge 11/24/2021 Achieved at eval  ?3 Pt will be able to verbalize understanding of the importance of attending the post op After Breast CA Class for further lymphedema risk reduction education and therapeutic exercise.  ?Baseline:  No knowledge 11/24/2021 Achieved at eval  ?4 Pt will demo she has regained full shoulder ROM and function post operatively compared to baselines.  ?Baseline: See objective measurements taken today. 01/07/22   ? ? ? ?PLAN: ?PT FREQUENCY/DURATION: EVAL and 1 follow up appointment.  ? ?PLAN FOR NEXT SESSION: will reassess 3-4 weeks post op to determine needs. ?  ?Patient will follow up at outpatient cancer rehab 3-4 weeks following surgery.  If the patient requires physical therapy at that time, a specific plan will be dictated and sent to the referring physician for  approval. ?The patient was educated today on appropriate basic range of motion exercises to begin post operatively and the importance of attending the After Breast Cancer class following surgery.  Patient w

## 2021-11-23 NOTE — Progress Notes (Signed)
?Radiation Oncology         [phone] ?________________________________ ? ?Initial Outpatient Consultation ? ?Name: [name] MRN: [id number]  ?Date: 11/24/2021  DOB: 07/31/1951 ? ?RC:BULAG, Paula Manner, MD  Rolm Bookbinder, MD  ? ?REFERRING PHYSICIAN: Rolm Bookbinder, MD ? ?DIAGNOSIS: The encounter diagnosis was Invasive lobular carcinoma of right breast in female [facility]). ? ?Stage IA (cT1b, cN0, cM0) Right Breast UIQ, Invasive Lobular Carcinoma, ER+ / PR+ / Her2-, Grade 1-2 ? ?HISTORY OF PRESENT ILLNESS::[name] is a 71 y.o. female who is accompanied by her husband. she is seen as a courtesy of Dr. Donne Hazel for an opinion concerning radiation therapy as part of management for her recently diagnosed right breast cancer.  ? ?The patient underwent a routine screening mammogram on 09/23/21 which showed possible abnormalities in both breasts. Diagnostic bilateral mammogram and right breast ultrasound on 10/11/21 further revealed a suspicious right breast mass corresponding with distortion seen on screening mammogram. Imaging also showed indeterminate right axillary lymphadenopathy. No persistent suspicious findings were appreciated within the left breast.  ? ?Right breast biopsy at the 1 o'clock position on 10/18/21 showed grade 1/2 invasive lobular carcinoma measuring 1.3 cm in the greatest dimension. Prognostic indicators significant for: estrogen receptor 60% positive; progesterone receptor 50% positive (both with strong staining intensity); Proliferation marker Ki67 at 2%; Her2 status negative; Grade 1-2.  Biopsy of a right axillary lymph node also performed revealed no evidence of carcinoma with hemosiderin deposition (suggestive of a reactive process such as dermatopathic lymphadenopathy).  ? ?Subsequently, the patient was referred to Dr. Donne Hazel on 11/18/21 for further management. Following discussion of the risks and benefits, the patient agreed to proceed with right breast lumpectomy and SLN  biopsies. Procedure is scheduled for 12/12/21.  ? ?The patient has met Dr. Delton Coombes and will return following lumpectomy to discuss treatment options further. Dr. Delton Coombes also advised the patient to pursue genetic testing given that her mother and sister both had colon cancer ? ?Menarche at 71 years old and menopause at age 71  ?She had 3 pregnancies, her first child was born at age 66  ?She received birth control pills for approximately 6 years.  ?She was exposed to fertility medications or hormone replacement therapy for 1 year.  ? ?Prior to biopsy she denied any pain within the breast area nipple discharge or bleeding.  She reports annual mammograms.  She denies ever having a biopsy of either breast prior to the recent biopsy which showed malignancy. ? ?PREVIOUS RADIATION THERAPY: No ? ?PAST MEDICAL HISTORY:  ?Past Medical History:  ?Diagnosis Date  ? Abnormal Pap smear   ? Arthritis   ? Colon polyps   ? Hx of seasonal allergies   ? Hypertension   ? Postmenopausal HRT (hormone replacement therapy) 11/14/2012  ? Vaginal Pap smear, abnormal   ? ? ?PAST SURGICAL HISTORY: ?Past Surgical History:  ?Procedure Laterality Date  ? COLONOSCOPY N/A 04/15/2014  ? Procedure: COLONOSCOPY;  Surgeon: Daneil Dolin, MD;  [facility];  Service: Endoscopy;  Laterality: N/A;  9:30 AM  ? COLONOSCOPY N/A 06/06/2017  ? Procedure: COLONOSCOPY;  Surgeon: Daneil Dolin, MD;  [facility];  Service: Endoscopy;  Laterality: N/A;  8:30 AM  ? COLONOSCOPY WITH PROPOFOL N/A 01/13/2021  ? Procedure: COLONOSCOPY WITH PROPOFOL;  Surgeon: Daneil Dolin, MD;  [facility];  Service: Endoscopy;  Laterality: N/A;  am  ? LT KNEE SURGERY    ? POLYPECTOMY  06/06/2017  ?  Procedure: POLYPECTOMY;  Surgeon: Daneil Dolin, MD;  [facility];  Service: Endoscopy;;  Ascending colon x2 (HS)  ? POLYPECTOMY  01/13/2021  ? Procedure: POLYPECTOMY INTESTINAL;  Surgeon: Daneil Dolin, MD;  [facility];   Service: Endoscopy;;  ? SHOULDER SURGERY    ? thumb surger    ? WRIST SURGERY    ? ? ?FAMILY HISTORY:  ?Family History  ?Problem Relation Age of Onset  ? Diabetes Mother   ? Colon cancer Mother 29  ? Colon cancer Sister 41  ? Diabetes Maternal Aunt   ? Depression Sister   ? Arthritis Sister   ? ? ?SOCIAL HISTORY:  ?Social History  ? ?Tobacco Use  ? Smoking status: Never  ? Smokeless tobacco: Never  ?Vaping Use  ? Vaping Use: Never used  ?Substance Use Topics  ? Alcohol use: No  ?  Comment: occ  ? Drug use: No  ? ? ?ALLERGIES: No Known Allergies ? ?MEDICATIONS:  ?Current Outpatient Medications  ?Medication Sig Dispense Refill  ? acetaminophen (TYLENOL) 500 MG tablet Take 500 mg by mouth as needed for mild pain. Only as needed    ? Calcium Carbonate-Vitamin D (CALCIUM 600 + D PO) Take 1 tablet by mouth daily.    ? cetirizine (ZYRTEC) 10 MG tablet Take 10 mg by mouth daily as needed for allergies. As needed    ? Multiple Vitamins-Minerals (ONE-A-DAY WOMENS 50 PLUS PO) Take 1 tablet by mouth daily.    ? valsartan (DIOVAN) 80 MG tablet Take 80 mg by mouth at bedtime.    ? ?No current facility-administered medications for this encounter.  ? ? ?REVIEW OF SYSTEMS:  A 10+ POINT REVIEW OF SYSTEMS WAS OBTAINED including neurology, dermatology, psychiatry, cardiac, respiratory, lymph, extremities, GI, GU, musculoskeletal, constitutional, reproductive, HEENT.  She denies any pain within the breast area nipple discharge or bleeding.  She denies any problems with swelling in her right arm. ?  ?PHYSICAL EXAM:  height is _0  (1.575 m) and weight is 132 lb (59.9 kg). Her temperature is 98 ?F (36.7 ?C). Her blood pressure is 144/67 (abnormal) and her pulse is 70. Her respiration is 18 and oxygen saturation is 99%.   ?General: Alert and oriented, in no acute distress ?HEENT: Head is normocephalic. Extraocular movements are intact. Oropharynx is clear. ?Neck: Neck is supple, no palpable cervical or supraclavicular  lymphadenopathy. ?Heart: Regular in rate and rhythm with no murmurs, rubs, or gallops. ?Chest: Clear to auscultation bilaterally, with no rhonchi, wheezes, or rales. ?Abdomen: Soft, nontender, nondistended, with no rigidity or guarding. ?Extremities: No cyanosis or edema. ?Lymphatics: see Neck Exam ?Skin: No concerning lesions. ?Musculoskeletal: symmetric strength and muscle tone throughout. ?Neurologic: Cranial nerves II through XII are grossly intact. No obvious focalities. Speech is fluent. Coordination is intact. ?Psychiatric: Judgment and insight are intact. Affect is appropriate. ? ?Left Breast: no palpable mass, nipple discharge or bleeding. ?Right breast: No palpable mass nipple discharge or bleeding.  Unable to identify any biopsy site within the breast area. ? ?ECOG = 1 ? ?0 - Asymptomatic (Fully active, able to carry on all predisease activities without restriction) ? ?1 - Symptomatic but completely ambulatory (Restricted in physically strenuous activity but ambulatory and able to carry out work of a light or sedentary nature. For example, light housework, office work) ? ?2 - Symptomatic, <50% in bed during the day (Ambulatory and capable of all self care but unable to carry out any work activities. Up and about  more than 50% of waking hours) ? ?3 - Symptomatic, >50% in bed, but not bedbound (Capable of only limited self-care, confined to bed or chair 50% or more of waking hours) ? ?4 - Bedbound (Completely disabled. Cannot carry on any self-care. Totally confined to bed or chair) ? ?5 - Death ? ? Oken MM, Creech RH, Tormey DC, et al. [phone]). "Toxicity and response criteria of the [facility]". [place] Oncol. 5 (6): 649-55 ? ?LABORATORY DATA:  ?Lab Results  ?Component Value Date  ? HGB 13.0 05/22/2011  ? ?No results found for: NA, K, CL, CO2, GLUCOSE, CREATININE, CALCIUM ? ?  ?RADIOGRAPHY: No results found. ?   ?IMPRESSION: Stage IA (cT1b, cN0, cM0) Right Breast UIQ, Invasive  Lobular Carcinoma, ER+ / PR+ / Her2-, Grade 1-2 ? ?The patient would be an excellent candidate for breast conserving therapy with radiation therapy as a component.  We discussed that the other surgical option would be a mas

## 2021-11-24 ENCOUNTER — Encounter (HOSPITAL_COMMUNITY): Payer: Self-pay

## 2021-11-24 ENCOUNTER — Encounter: Payer: Self-pay | Admitting: Radiation Oncology

## 2021-11-24 ENCOUNTER — Other Ambulatory Visit: Payer: Self-pay

## 2021-11-24 ENCOUNTER — Ambulatory Visit: Payer: Medicare Other | Attending: General Surgery | Admitting: Rehabilitation

## 2021-11-24 ENCOUNTER — Encounter: Payer: Self-pay | Admitting: Rehabilitation

## 2021-11-24 ENCOUNTER — Ambulatory Visit
Admission: RE | Admit: 2021-11-24 | Discharge: 2021-11-24 | Disposition: A | Discharge status: Home / Self Care | Payer: Medicare Other | Source: Ambulatory Visit | Attending: Radiation Oncology | Admitting: Radiation Oncology

## 2021-11-24 VITALS — BP 144/67 | HR 70 | Temp 98.0°F | Resp 18 | Ht 62.0 in | Wt 132.0 lb

## 2021-11-24 DIAGNOSIS — I10 Essential (primary) hypertension: Secondary | ICD-10-CM | POA: Diagnosis not present

## 2021-11-24 DIAGNOSIS — C50211 Malignant neoplasm of upper-inner quadrant of right female breast: Secondary | ICD-10-CM | POA: Diagnosis not present

## 2021-11-24 DIAGNOSIS — Z170 Estrogen receptor positive status [ER+]: Secondary | ICD-10-CM | POA: Diagnosis not present

## 2021-11-24 DIAGNOSIS — C50911 Malignant neoplasm of unspecified site of right female breast: Secondary | ICD-10-CM

## 2021-11-24 DIAGNOSIS — R293 Abnormal posture: Secondary | ICD-10-CM | POA: Insufficient documentation

## 2021-11-24 DIAGNOSIS — Z79890 Hormone replacement therapy (postmenopausal): Secondary | ICD-10-CM | POA: Insufficient documentation

## 2021-11-24 DIAGNOSIS — Z800 Family history of malignant neoplasm of digestive organs: Secondary | ICD-10-CM | POA: Insufficient documentation

## 2021-11-24 DIAGNOSIS — C50411 Malignant neoplasm of upper-outer quadrant of right female breast: Secondary | ICD-10-CM | POA: Diagnosis not present

## 2021-11-24 DIAGNOSIS — Z86010 Personal history of colonic polyps: Secondary | ICD-10-CM | POA: Insufficient documentation

## 2021-11-24 NOTE — Progress Notes (Signed)
See MD note for nursing evaluation. °

## 2021-11-28 ENCOUNTER — Inpatient Hospital Stay: Payer: Medicare Other | Admitting: Hematology and Oncology

## 2021-11-28 ENCOUNTER — Inpatient Hospital Stay: Payer: Medicare Other

## 2021-12-01 ENCOUNTER — Other Ambulatory Visit: Payer: Self-pay

## 2021-12-01 ENCOUNTER — Encounter (HOSPITAL_BASED_OUTPATIENT_CLINIC_OR_DEPARTMENT_OTHER): Payer: Self-pay | Admitting: General Surgery

## 2021-12-09 ENCOUNTER — Encounter (HOSPITAL_BASED_OUTPATIENT_CLINIC_OR_DEPARTMENT_OTHER)
Admission: RE | Admit: 2021-12-09 | Discharge: 2021-12-09 | Disposition: A | Discharge status: Home / Self Care | Payer: Medicare Other | Source: Ambulatory Visit | Attending: General Surgery | Admitting: General Surgery

## 2021-12-09 ENCOUNTER — Ambulatory Visit
Admission: RE | Admit: 2021-12-09 | Discharge: 2021-12-09 | Disposition: A | Discharge status: Home / Self Care | Payer: Medicare Other | Source: Ambulatory Visit | Attending: General Surgery | Admitting: General Surgery

## 2021-12-09 DIAGNOSIS — C50211 Malignant neoplasm of upper-inner quadrant of right female breast: Secondary | ICD-10-CM | POA: Diagnosis not present

## 2021-12-09 DIAGNOSIS — C50911 Malignant neoplasm of unspecified site of right female breast: Secondary | ICD-10-CM

## 2021-12-09 DIAGNOSIS — Z01810 Encounter for preprocedural cardiovascular examination: Secondary | ICD-10-CM | POA: Insufficient documentation

## 2021-12-09 NOTE — Progress Notes (Signed)

## 2021-12-11 NOTE — Anesthesia Preprocedure Evaluation (Addendum)
Anesthesia Evaluation  ?Patient identified by MRN, date of birth, ID band ?Patient awake ? ? ? ?Reviewed: ?Allergy & Precautions, NPO status , Patient's Chart, lab work & pertinent test results ? ?Airway ?Mallampati: II ? ?TM Distance: >3 FB ?Neck ROM: Full ? ? ? Dental ? ?(+) Dental Advisory Given, Teeth Intact, Caps ?Crowns :   ?Pulmonary ?neg pulmonary ROS,  ?  ?Pulmonary exam normal ?breath sounds clear to auscultation ? ? ? ? ? ? Cardiovascular ?Exercise Tolerance: Good ?hypertension, Pt. on medications ?Normal cardiovascular exam ?Rhythm:Regular Rate:Normal ? ? ?  ?Neuro/Psych ?negative neurological ROS ?   ? GI/Hepatic ?negative GI ROS, Neg liver ROS,   ?Endo/Other  ?negative endocrine ROS ? Renal/GU ?negative Renal ROS  ? ?  ?Musculoskeletal ? ?(+) Arthritis ,  ? Abdominal ?  ?Peds ? Hematology ?negative hematology ROS ?(+)   ?Anesthesia Other Findings ? ? Reproductive/Obstetrics ? ?  ? ? ? ? ? ? ? ? ? ? ? ? ? ?  ?  ? ? ? ? ? ? ? ?Anesthesia Physical ? ?Anesthesia Plan ? ?ASA: 2 ? ?Anesthesia Plan: General  ? ?Post-op Pain Management: Tylenol PO (pre-op)* and Celebrex PO (pre-op)*  ? ?Induction: Intravenous ? ?PONV Risk Score and Plan: 3 and TIVA, Ondansetron, Dexamethasone, Treatment may vary due to age or medical condition and Midazolam ? ?Airway Management Planned: LMA ? ?Additional Equipment:  ? ?Intra-op Plan:  ? ?Post-operative Plan: Extubation in OR ? ?Informed Consent: I have reviewed the patients History and Physical, chart, labs and discussed the procedure including the risks, benefits and alternatives for the proposed anesthesia with the patient or authorized representative who has indicated his/her understanding and acceptance.  ? ? ? ?Dental advisory given ? ?Plan Discussed with: CRNA ? ?Anesthesia Plan Comments:   ? ? ? ? ? ?Anesthesia Quick Evaluation ? ?

## 2021-12-12 ENCOUNTER — Encounter (HOSPITAL_BASED_OUTPATIENT_CLINIC_OR_DEPARTMENT_OTHER)
Admission: RE | Disposition: A | Discharge status: Home / Self Care | Payer: Self-pay | Source: Home / Self Care | Attending: General Surgery

## 2021-12-12 ENCOUNTER — Ambulatory Visit
Admission: RE | Admit: 2021-12-12 | Discharge: 2021-12-12 | Disposition: A | Discharge status: Home / Self Care | Payer: Medicare Other | Source: Ambulatory Visit | Attending: General Surgery | Admitting: General Surgery

## 2021-12-12 ENCOUNTER — Other Ambulatory Visit: Payer: Self-pay

## 2021-12-12 ENCOUNTER — Encounter (HOSPITAL_BASED_OUTPATIENT_CLINIC_OR_DEPARTMENT_OTHER): Payer: Self-pay | Admitting: General Surgery

## 2021-12-12 ENCOUNTER — Ambulatory Visit (HOSPITAL_BASED_OUTPATIENT_CLINIC_OR_DEPARTMENT_OTHER): Payer: Medicare Other | Admitting: Anesthesiology

## 2021-12-12 ENCOUNTER — Ambulatory Visit (HOSPITAL_BASED_OUTPATIENT_CLINIC_OR_DEPARTMENT_OTHER)
Admission: RE | Admit: 2021-12-12 | Discharge: 2021-12-12 | Disposition: A | Discharge status: Home / Self Care | Payer: Medicare Other | Attending: General Surgery | Admitting: General Surgery

## 2021-12-12 DIAGNOSIS — C50911 Malignant neoplasm of unspecified site of right female breast: Secondary | ICD-10-CM

## 2021-12-12 DIAGNOSIS — Z170 Estrogen receptor positive status [ER+]: Secondary | ICD-10-CM

## 2021-12-12 DIAGNOSIS — G8918 Other acute postprocedural pain: Secondary | ICD-10-CM | POA: Diagnosis not present

## 2021-12-12 DIAGNOSIS — I10 Essential (primary) hypertension: Secondary | ICD-10-CM | POA: Insufficient documentation

## 2021-12-12 DIAGNOSIS — N6021 Fibroadenosis of right breast: Secondary | ICD-10-CM | POA: Insufficient documentation

## 2021-12-12 DIAGNOSIS — C50211 Malignant neoplasm of upper-inner quadrant of right female breast: Secondary | ICD-10-CM | POA: Insufficient documentation

## 2021-12-12 HISTORY — PX: BREAST LUMPECTOMY WITH RADIOACTIVE SEED AND SENTINEL LYMPH NODE BIOPSY: SHX6550

## 2021-12-12 SURGERY — BREAST LUMPECTOMY WITH RADIOACTIVE SEED AND SENTINEL LYMPH NODE BIOPSY
Anesthesia: General | Site: Breast | Laterality: Right

## 2021-12-12 MED ORDER — ACETAMINOPHEN 500 MG PO TABS
ORAL_TABLET | ORAL | Status: AC
  Filled 2021-12-12: qty 2

## 2021-12-12 MED ORDER — DEXAMETHASONE SODIUM PHOSPHATE 10 MG/ML IJ SOLN
INTRAMUSCULAR | Status: AC
  Filled 2021-12-12: qty 1

## 2021-12-12 MED ORDER — ROPIVACAINE HCL 5 MG/ML IJ SOLN
INTRAMUSCULAR | Status: DC | PRN
  Administered 2021-12-12: 30 mL

## 2021-12-12 MED ORDER — OXYCODONE HCL 5 MG PO TABS: 5.0000 mg | ORAL_TABLET | Freq: Four times a day (QID) | ORAL | 0 refills | Status: DC | PRN

## 2021-12-12 MED ORDER — LIDOCAINE 2% (20 MG/ML) 5 ML SYRINGE
INTRAMUSCULAR | Status: AC
  Filled 2021-12-12: qty 5

## 2021-12-12 MED ORDER — LACTATED RINGERS IV SOLN: INTRAVENOUS | Status: DC

## 2021-12-12 MED ORDER — ACETAMINOPHEN 500 MG PO TABS: 1000.0000 mg | ORAL_TABLET | Freq: Once | ORAL | Status: DC

## 2021-12-12 MED ORDER — DEXAMETHASONE SODIUM PHOSPHATE 4 MG/ML IJ SOLN
INTRAMUSCULAR | Status: DC | PRN
  Administered 2021-12-12: 5 mg

## 2021-12-12 MED ORDER — FENTANYL CITRATE (PF) 100 MCG/2ML IJ SOLN
50.0000 ug | Freq: Once | INTRAMUSCULAR | Status: AC
  Administered 2021-12-12: 50 ug via INTRAVENOUS

## 2021-12-12 MED ORDER — EPHEDRINE 5 MG/ML INJ
INTRAVENOUS | Status: AC
  Filled 2021-12-12: qty 5

## 2021-12-12 MED ORDER — CHLORHEXIDINE GLUCONATE CLOTH 2 % EX PADS: 6.0000 | MEDICATED_PAD | Freq: Once | CUTANEOUS | Status: DC

## 2021-12-12 MED ORDER — DEXAMETHASONE SODIUM PHOSPHATE 10 MG/ML IJ SOLN
INTRAMUSCULAR | Status: AC
  Filled 2021-12-12: qty 2

## 2021-12-12 MED ORDER — PROPOFOL 10 MG/ML IV BOLUS
INTRAVENOUS | Status: AC
  Filled 2021-12-12: qty 20

## 2021-12-12 MED ORDER — MIDAZOLAM HCL 2 MG/2ML IJ SOLN
1.0000 mg | Freq: Once | INTRAMUSCULAR | Status: AC
  Administered 2021-12-12: 1 mg via INTRAVENOUS

## 2021-12-12 MED ORDER — ONDANSETRON HCL 4 MG/2ML IJ SOLN
INTRAMUSCULAR | Status: AC
Start: 2021-12-12 — End: ?
  Filled 2021-12-12: qty 2

## 2021-12-12 MED ORDER — ONDANSETRON HCL 4 MG/2ML IJ SOLN
INTRAMUSCULAR | Status: DC | PRN
  Administered 2021-12-12: 4 mg via INTRAVENOUS

## 2021-12-12 MED ORDER — PROPOFOL 500 MG/50ML IV EMUL
INTRAVENOUS | Status: DC | PRN
  Administered 2021-12-12: 125 ug/kg/min via INTRAVENOUS

## 2021-12-12 MED ORDER — ONDANSETRON HCL 4 MG/2ML IJ SOLN
INTRAMUSCULAR | Status: AC
  Filled 2021-12-12: qty 2

## 2021-12-12 MED ORDER — LIDOCAINE 2% (20 MG/ML) 5 ML SYRINGE
INTRAMUSCULAR | Status: AC
Start: 2021-12-12 — End: ?
  Filled 2021-12-12: qty 5

## 2021-12-12 MED ORDER — MIDAZOLAM HCL 2 MG/2ML IJ SOLN
INTRAMUSCULAR | Status: AC
  Filled 2021-12-12: qty 2

## 2021-12-12 MED ORDER — FENTANYL CITRATE (PF) 100 MCG/2ML IJ SOLN
INTRAMUSCULAR | Status: AC
  Filled 2021-12-12: qty 2

## 2021-12-12 MED ORDER — PROPOFOL 500 MG/50ML IV EMUL
INTRAVENOUS | Status: AC
  Filled 2021-12-12: qty 50

## 2021-12-12 MED ORDER — LIDOCAINE 2% (20 MG/ML) 5 ML SYRINGE
INTRAMUSCULAR | Status: DC | PRN
  Administered 2021-12-12: 80 mg via INTRAVENOUS

## 2021-12-12 MED ORDER — EPHEDRINE SULFATE-NACL 50-0.9 MG/10ML-% IV SOSY
PREFILLED_SYRINGE | INTRAVENOUS | Status: DC | PRN
  Administered 2021-12-12: 10 mg via INTRAVENOUS

## 2021-12-12 MED ORDER — ENSURE PRE-SURGERY PO LIQD: 296.0000 mL | Freq: Once | ORAL | Status: DC

## 2021-12-12 MED ORDER — FENTANYL CITRATE (PF) 100 MCG/2ML IJ SOLN
INTRAMUSCULAR | Status: DC | PRN
  Administered 2021-12-12: 50 ug via INTRAVENOUS

## 2021-12-12 MED ORDER — ACETAMINOPHEN 500 MG PO TABS
1000.0000 mg | ORAL_TABLET | ORAL | Status: AC
  Administered 2021-12-12: 1000 mg via ORAL

## 2021-12-12 MED ORDER — CELECOXIB 200 MG PO CAPS
200.0000 mg | ORAL_CAPSULE | Freq: Once | ORAL | Status: AC
  Administered 2021-12-12: 200 mg via ORAL

## 2021-12-12 MED ORDER — PROPOFOL 10 MG/ML IV BOLUS
INTRAVENOUS | Status: DC | PRN
  Administered 2021-12-12: 140 mg via INTRAVENOUS

## 2021-12-12 MED ORDER — CLONIDINE HCL (ANALGESIA) 100 MCG/ML EP SOLN
EPIDURAL | Status: DC | PRN
  Administered 2021-12-12: 70 ug

## 2021-12-12 MED ORDER — CEFAZOLIN SODIUM-DEXTROSE 2-4 GM/100ML-% IV SOLN
2.0000 g | INTRAVENOUS | Status: AC
  Administered 2021-12-12: 2 g via INTRAVENOUS

## 2021-12-12 MED ORDER — MAGTRACE LYMPHATIC TRACER
INTRAMUSCULAR | Status: DC | PRN
  Administered 2021-12-12: 2 mL via INTRAMUSCULAR

## 2021-12-12 MED ORDER — BUPIVACAINE HCL (PF) 0.25 % IJ SOLN
INTRAMUSCULAR | Status: DC | PRN
  Administered 2021-12-12: 6 mL

## 2021-12-12 MED ORDER — CELECOXIB 200 MG PO CAPS
ORAL_CAPSULE | ORAL | Status: AC
  Filled 2021-12-12: qty 1

## 2021-12-12 MED ORDER — CEFAZOLIN SODIUM-DEXTROSE 2-4 GM/100ML-% IV SOLN
INTRAVENOUS | Status: AC
  Filled 2021-12-12: qty 100

## 2021-12-12 MED ORDER — DEXAMETHASONE SODIUM PHOSPHATE 10 MG/ML IJ SOLN
INTRAMUSCULAR | Status: DC | PRN
Start: 2021-12-12 — End: 2021-12-12
  Administered 2021-12-12: 10 mg via INTRAVENOUS

## 2021-12-12 SURGICAL SUPPLY — 62 items
ADH SKN CLS APL DERMABOND .7 (GAUZE/BANDAGES/DRESSINGS) ×1
APL PRP STRL LF DISP 70% ISPRP (MISCELLANEOUS) ×1
APPLIER CLIP 9.375 MED OPEN (MISCELLANEOUS) ×2
APR CLP MED 9.3 20 MLT OPN (MISCELLANEOUS) ×1
BINDER BREAST LRG (GAUZE/BANDAGES/DRESSINGS) ×1 IMPLANT
BINDER BREAST MEDIUM (GAUZE/BANDAGES/DRESSINGS) IMPLANT
BINDER BREAST XLRG (GAUZE/BANDAGES/DRESSINGS) IMPLANT
BINDER BREAST XXLRG (GAUZE/BANDAGES/DRESSINGS) IMPLANT
BLADE SURG 15 STRL LF DISP TIS (BLADE) ×1 IMPLANT
BLADE SURG 15 STRL SS (BLADE) ×2
CANISTER SUC SOCK COL 7IN (MISCELLANEOUS) IMPLANT
CANISTER SUCT 1200ML W/VALVE (MISCELLANEOUS) IMPLANT
CHLORAPREP W/TINT 26 (MISCELLANEOUS) ×2 IMPLANT
CLIP APPLIE 9.375 MED OPEN (MISCELLANEOUS) IMPLANT
CLIP TI WIDE RED SMALL 6 (CLIP) ×2 IMPLANT
COVER BACK TABLE 60X90IN (DRAPES) ×2 IMPLANT
COVER MAYO STAND STRL (DRAPES) ×2 IMPLANT
COVER PROBE W GEL 5X96 (DRAPES) ×2 IMPLANT
DERMABOND ADVANCED (GAUZE/BANDAGES/DRESSINGS) ×1
DERMABOND ADVANCED .7 DNX12 (GAUZE/BANDAGES/DRESSINGS) ×1 IMPLANT
DRAPE LAPAROSCOPIC ABDOMINAL (DRAPES) ×2 IMPLANT
DRAPE UTILITY XL STRL (DRAPES) ×2 IMPLANT
ELECT COATED BLADE 2.86 ST (ELECTRODE) ×2 IMPLANT
ELECT REM PT RETURN 9FT ADLT (ELECTROSURGICAL) ×2
ELECTRODE REM PT RTRN 9FT ADLT (ELECTROSURGICAL) ×1 IMPLANT
GLOVE BIO SURGEON STRL SZ7 (GLOVE) ×4 IMPLANT
GLOVE BIOGEL PI IND STRL 7.0 (GLOVE) IMPLANT
GLOVE BIOGEL PI IND STRL 7.5 (GLOVE) ×1 IMPLANT
GLOVE BIOGEL PI INDICATOR 7.0 (GLOVE) ×2
GLOVE BIOGEL PI INDICATOR 7.5 (GLOVE) ×1
GLOVE ECLIPSE 6.5 STRL STRAW (GLOVE) ×1 IMPLANT
GOWN STRL REUS W/ TWL LRG LVL3 (GOWN DISPOSABLE) ×2 IMPLANT
GOWN STRL REUS W/TWL LRG LVL3 (GOWN DISPOSABLE) ×4
HEMOSTAT ARISTA ABSORB 3G PWDR (HEMOSTASIS) IMPLANT
KIT MARKER MARGIN INK (KITS) ×2 IMPLANT
NDL HYPO 25X1 1.5 SAFETY (NEEDLE) ×1 IMPLANT
NDL SAFETY ECLIPSE 18X1.5 (NEEDLE) IMPLANT
NEEDLE HYPO 18GX1.5 SHARP (NEEDLE)
NEEDLE HYPO 25X1 1.5 SAFETY (NEEDLE) ×2 IMPLANT
NS IRRIG 1000ML POUR BTL (IV SOLUTION) IMPLANT
PACK BASIN DAY SURGERY FS (CUSTOM PROCEDURE TRAY) ×2 IMPLANT
PENCIL SMOKE EVACUATOR (MISCELLANEOUS) ×2 IMPLANT
RETRACTOR ONETRAX LX 90X20 (MISCELLANEOUS) IMPLANT
SLEEVE SCD COMPRESS KNEE MED (STOCKING) ×2 IMPLANT
SPIKE FLUID TRANSFER (MISCELLANEOUS) IMPLANT
SPONGE T-LAP 4X18 ~~LOC~~+RFID (SPONGE) ×3 IMPLANT
STRIP CLOSURE SKIN 1/2X4 (GAUZE/BANDAGES/DRESSINGS) ×2 IMPLANT
SUT ETHILON 2 0 FS 18 (SUTURE) IMPLANT
SUT MNCRL AB 4-0 PS2 18 (SUTURE) ×2 IMPLANT
SUT MON AB 5-0 PS2 18 (SUTURE) ×1 IMPLANT
SUT SILK 2 0 SH (SUTURE) ×1 IMPLANT
SUT VIC AB 2-0 SH 27 (SUTURE) ×4
SUT VIC AB 2-0 SH 27XBRD (SUTURE) ×1 IMPLANT
SUT VIC AB 3-0 SH 27 (SUTURE) ×2
SUT VIC AB 3-0 SH 27X BRD (SUTURE) ×1 IMPLANT
SUT VIC AB 5-0 PS2 18 (SUTURE) IMPLANT
SYR CONTROL 10ML LL (SYRINGE) ×2 IMPLANT
TOWEL GREEN STERILE FF (TOWEL DISPOSABLE) ×2 IMPLANT
TRACER MAGTRACE VIAL (MISCELLANEOUS) ×1 IMPLANT
TRAY FAXITRON CT DISP (TRAY / TRAY PROCEDURE) ×2 IMPLANT
TUBE CONNECTING 20X1/4 (TUBING) IMPLANT
YANKAUER SUCT BULB TIP NO VENT (SUCTIONS) IMPLANT

## 2021-12-12 NOTE — Interval H&P Note (Signed)
History and Physical Interval Note: ? ?12/12/2021 ?7:20 AM ? ?[name]  has presented today for surgery, with the diagnosis of BREAST CANCER.  The various methods of treatment have been discussed with the patient and family. After consideration of risks, benefits and other options for treatment, the patient has consented to  Procedure(s): ?RIGHT BREAST LUMPECTOMY WITH RADIOACTIVE SEED AND RIGHT AXILLARY SENTINEL LYMPH NODE BIOPSY (Right) as a surgical intervention.  The patient's history has been reviewed, patient examined, no change in status, stable for surgery.  I have reviewed the patient's chart and labs.  Questions were answered to the patient's satisfaction.   ? ? ?Rolm Bookbinder ? ? ?

## 2021-12-12 NOTE — Interval H&P Note (Signed)
History and Physical Interval Note: ? ?12/12/2021 ?8:27 AM ? ?[name]  has presented today for surgery, with the diagnosis of RIGHT BREAST CANCER.  The various methods of treatment have been discussed with the patient and family. After consideration of risks, benefits and other options for treatment, the patient has consented to  Procedure(s): ?RIGHT BREAST LUMPECTOMY WITH RADIOACTIVE SEED AND RIGHT AXILLARY SENTINEL LYMPH NODE BIOPSY (Right) as a surgical intervention.  The patient's history has been reviewed, patient examined, no change in status, stable for surgery.  I have reviewed the patient's chart and labs.  Questions were answered to the patient's satisfaction.   ? ? ?Rolm Bookbinder ? ? ?

## 2021-12-12 NOTE — Anesthesia Procedure Notes (Signed)
Anesthesia Regional Block: Pectoralis block  ? ?Pre-Anesthetic Checklist: , timeout performed,  Correct Patient, Correct Site, Correct Laterality,  Correct Procedure, Correct Position, site marked,  Risks and benefits discussed,  Surgical consent,  Pre-op evaluation,  At surgeon's request and post-op pain management ? ?Laterality: Right ? ?Prep: chloraprep     ?  ?Needles:  ? ?Needle Type: Stimiplex   ? ? ?Needle Length: 9cm  ? ? ? ? ?Additional Needles: ? ? ?Procedures:,,,, ultrasound used (permanent image in chart),,    ?Narrative:  ?Start time: 12/12/2021 7:45 AM ?End time: 12/12/2021 8:05 AM ?Injection made incrementally with aspirations every 5 mL. ? ?Performed by: Personally  ?Anesthesiologist: Nolon Nations, MD ? ?Additional Notes: ?BP cuff, SpO2 and EKG monitors applied. Sedation begun.  Anesthetic injected incrementally, slowly, and after neg aspirations under direct ultrasound guidance. Good fascial spread noted. Patient tolerated well.  ? ? ? ? ?

## 2021-12-12 NOTE — Anesthesia Procedure Notes (Signed)
Procedure Name: LMA Insertion ?Date/Time: 12/12/2021 8:48 AM ?Performed by: [place] Ivoree Felmlee C, CRNA ?Pre-anesthesia Checklist: Patient identified, Emergency Drugs available, Suction available and Patient being monitored ?Patient Re-evaluated:Patient Re-evaluated prior to induction ?Oxygen Delivery Method: Circle system utilized ?Preoxygenation: Pre-oxygenation with 100% oxygen ?Induction Type: IV induction ?Ventilation: Mask ventilation without difficulty ?LMA: LMA inserted ?LMA Size: 4.0 ?Number of attempts: 1 ?Airway Equipment and Method: Bite block ?Placement Confirmation: positive ETCO2 ?Tube secured with: Tape ?Dental Injury: Teeth and Oropharynx as per pre-operative assessment  ? ? ? ? ?

## 2021-12-12 NOTE — Op Note (Signed)
Preoperative diagnosis: Clinical stage I right breast cancer ?Postoperative diagnosis: Same as above ?Procedure: ?1.  Right breast radioactive seed guided lumpectomy ?2.  Right deep axillary sentinel lymph node biopsy ?3.  Injection of mag trace for sentinel lymph node identification ?Surgeon: Dr. Serita Grammes ?Anesthesia: General with a pectoral block ?Estimated blood loss: Minimal ?Complications: None ?Drains: None ?Specimens: ?1.  Right breast lumpectomy containing seed and clip ?2.  Additional superior and medial margins marked short superior, long lateral, double deep ?3.  Right  deep axillary sentinel lymph nodes with highest count of 1398 ?Sponge needle count was correct at completion ?Decision to recovery stable condition ? ?Indications: 71 year old otherwise healthy female who has no prior breast history no family history underwent a screening mammogram. She had no mass or discharge. She has C density breasts.  She had a right breast distortion in the upper inner quadrant. This on ultrasound measures 8 x 5 x 8 mm. She had a few borderline nodes with some cortical thickness. 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 This is 60% ER positive, PR positive at 50%, HER2 negative, and Ki-67 is 2%. The biopsy of the lymph node showed nodal tissue with hemosiderin deposition and no tumor. We elected to proceed with lumpectomy and sn biopsy.  ? ?Procedure: She first had a seed placed by radiology.  I had these mammograms available for my review.  After informed consent was obtained she then underwent a pectoral block.  I also prepped the area lateral to the areola.  I injected 2 cc of mag trace in the subareolar position without complication.  She was given antibiotics.  SCDs were placed.  She was then placed under general anesthesia without complication.  She was prepped and draped in the standard sterile surgical fashion.  A surgical timeout was then performed. ? ?I made a  periareolar incision to hide the scar.   I then dissected to the seed. I removed the seed and the surrounding tissue with an attempt to get a clear margin. Mammogram confirmed removal of the seed and the clip.  I viewed the images and thought I might be close to a couple margins so I removed these and passed them off the table.  Hemostasis was observed. I closed this with 2-0 vicryl for breast tissue. I then closed skin with 3-0 vicryl and 5-0 monocryl. Glue was placed.   ? ?I made a low axillary incision.I dissected through the axillary fascia.  I used the probe to identify what appeared to be several normal-appearing nodes that contained magtrace.  There were no other activity once I was completed this.  These were then passed off the table.  Hemostasis was observed.  I closed the breast tissue with 2-0 vicryl as well.  I then closed this with 2-0 Vicryl, 3-0 Vicryl, and 4-0 Monocryl. Glue and Steri-Strips were applied.  she tolerated well, was extubated and transferred to recovery stable. ?

## 2021-12-12 NOTE — Anesthesia Postprocedure Evaluation (Signed)
Anesthesia Post Note ? ?Patient: [name] ? ?Procedure(s) Performed: RIGHT BREAST LUMPECTOMY WITH RADIOACTIVE SEED AND RIGHT AXILLARY SENTINEL LYMPH NODE BIOPSY (Right: Breast) ? ?  ? ?Patient location during evaluation: PACU ?Anesthesia Type: General ?Level of consciousness: sedated and patient cooperative ?Pain management: pain level controlled ?Vital Signs Assessment: post-procedure vital signs reviewed and stable ?Respiratory status: spontaneous breathing ?Cardiovascular status: stable ?Anesthetic complications: no ? ? ?No notable events documented. ? ?Last Vitals:  ?Vitals:  ? 12/12/21 1015 12/12/21 1031  ?BP: 137/66 (!) 144/68  ?Pulse: 63 60  ?Resp: 12 18  ?Temp:  36.4 ?C  ?SpO2: 96% 98%  ?  ?Last Pain:  ?Vitals:  ? 12/12/21 1031  ?TempSrc:   ?PainSc: 0-No pain  ? ? ?  ?  ?  ?  ?  ?  ? ?Nolon Nations ? ? ? ? ?

## 2021-12-12 NOTE — Progress Notes (Signed)
Assisted Dr. Lissa Hoard with right, pectoralis block. Side rails up, monitors on throughout procedure. See vital signs in flow sheet. Tolerated Procedure well. ?

## 2021-12-12 NOTE — Discharge Instructions (Addendum)
[facility] ?Office Phone Number [phone] ? ? POST OP INSTRUCTIONS ?Take 400 mg of ibuprofen every 8 hours or 650 mg tylenol every 6 hours for next 72 hours then as needed. Use ice several times daily also. ? ? ?IF YOU HAVE DISABILITY OR FAMILY LEAVE FORMS, YOU MUST BRING THEM TO THE OFFICE FOR PROCESSING.   ? ?A prescription for pain medication may be given to you upon discharge.  Take your pain medication as prescribed, if needed.  If narcotic pain medicine is not needed, then you may take acetaminophen (Tylenol), naprosyn (Alleve) or ibuprofen (Advil) as needed. ?Take your usually prescribed medications unless otherwise directed ?If you need a refill on your pain medication, please contact your pharmacy.  They will contact our office to request authorization.  Prescriptions will not be filled after 5pm or on week-ends. ?You should eat very light the first 24 hours after surgery, such as soup, crackers, pudding, etc.  Resume your normal diet the day after surgery. ?Most patients will experience some swelling and bruising in the breast.  Ice packs and a good support bra will help.  Wear the breast binder provided or a sports bra for 72 hours day and night.  After that wear a sports bra during the day until you return to the office. Swelling and bruising can take several days to resolve.  ?It is common to experience some constipation if taking pain medication after surgery.  Increasing fluid intake and taking a stool softener will usually help or prevent this problem from occurring.  A mild laxative (Milk of Magnesia or Miralax) should be taken according to package directions if there are no bowel movements after 48 hours. ?I used skin glue on the incision, you may shower in 24 hours.  The glue will flake off over the next 2-3 weeks.  Any sutures or staples will be removed at the office during your follow-up visit. ?ACTIVITIES:  You may resume regular daily activities (gradually increasing)  beginning the next day.  Wearing a good support bra or sports bra minimizes pain and swelling.  You may have sexual intercourse when it is comfortable. ?You may drive when you no longer are taking prescription pain medication, you can comfortably wear a seatbelt, and you can safely maneuver your car and apply brakes. ?RETURN TO WORK:  ______________________________________________________________________________________ ?You should see your doctor in the office for a follow-up appointment approximately two weeks after your surgery.  Your doctor?s nurse will typically make your follow-up appointment when she calls you with your pathology report.  Expect your pathology report 3-4 business days after your surgery.  You may call to check if you do not hear from [place] after three days. ?OTHER INSTRUCTIONS: _______________________________________________________________________________________________ _____________________________________________________________________________________________________________________________________ ?_____________________________________________________________________________________________________________________________________ ?_____________________________________________________________________________________________________________________________________ ? ?WHEN TO CALL DR [place]: ?Fever over 101.0 ?Nausea and/or vomiting. ?Extreme swelling or bruising. ?Continued bleeding from incision. ?Increased pain, redness, or drainage from the incision. ? ?The clinic staff is available to answer your questions during regular business hours.  Please don?t hesitate to call and ask to speak to one of the nurses for clinical concerns.  If you have a medical emergency, go to the nearest emergency room or call 911.  A surgeon from [facility] is always on call at the hospital. ? ?For further questions, please visit [email or web address] ? ? ? ?Post Anesthesia Home Care  Instructions ? ?Activity: ?Get plenty of rest for the remainder of the day. A responsible individual must stay with you for 24 hours following the  procedure.  ?For the next 24 hours, DO NOT: ?-Drive a car ?-Paediatric nurse ?-Drink alcoholic beverages ?-Take any medication unless instructed by your physician ?-Make any legal decisions or sign important papers. ? ?Meals: ?Start with liquid foods such as gelatin or soup. Progress to regular foods as tolerated. Avoid greasy, spicy, heavy foods. If nausea and/or vomiting occur, drink only clear liquids until the nausea and/or vomiting subsides. Call your physician if vomiting continues. ? ?Special Instructions/Symptoms: ?Your throat may feel dry or sore from the anesthesia or the breathing tube placed in your throat during surgery. If this causes discomfort, gargle with warm salt water. The discomfort should disappear within 24 hours. ? ?If you had a scopolamine patch placed behind your ear for the management of post- operative nausea and/or vomiting: ? ?1. The medication in the patch is effective for 72 hours, after which it should be removed.  Wrap patch in a tissue and discard in the trash. Wash hands thoroughly with soap and water. ?2. You may remove the patch earlier than 72 hours if you experience unpleasant side effects which may include dry mouth, dizziness or visual disturbances. ?3. Avoid touching the patch. Wash your hands with soap and water after contact with the patch. ?    ?No tylenol or ibuprofen until after 2:30pm today if needed. ? ?

## 2021-12-12 NOTE — H&P (Signed)
?71 year old otherwise healthy female who has no prior breast history no family history underwent a screening mammogram. She had no mass or discharge. She has C density breasts. Her mammogram initially showed something on the left that ends up being nothing. She had a right breast distortion in the upper inner quadrant. This on ultrasound measures 8 x 5 x 8 mm. She had a few borderline nodes with some cortical thickness. 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 This is 60% ER positive, PR positive at 50%, HER2 negative, and Ki-67 is 2%. The biopsy of the lymph node showed nodal tissue with hemosiderin deposition and no tumor. She is here today with her husband to discuss options. ? ?Review of Systems: ?A complete review of systems was obtained from the patient. I have reviewed this information and discussed as appropriate with the patient. See HPI as well for other ROS. ? ?Review of Systems  ?All other systems reviewed and are negative. ? ? ?Medical History: ?Past Medical History:  ?Diagnosis Date  ? Arthritis  ? ?Patient Active Problem List  ?Diagnosis  ? Hypertension, essential  ? ?Past Surgical History:  ?Procedure Laterality Date  ? JOINT REPLACEMENT  ? ? ?No Known Allergies ? ?Current Outpatient Medications on File Prior to Visit  ?Medication Sig Dispense Refill  ? calcium carbonate-vitamin D3 600 mg-5 mcg (200 unit) Cap Take 1 tablet by mouth  ? valsartan (DIOVAN) 80 MG tablet  ? ?Family History  ?Problem Relation Age of Onset  ? Diabetes Mother  ? Colon cancer Mother  ? Colon cancer Sister  ? ? ?Social History  ? ?Tobacco Use  ?Smoking Status Never  ?Smokeless Tobacco Never  ? ? ?Social History  ? ?Socioeconomic History  ? Marital status: Married  ?Tobacco Use  ? Smoking status: Never  ? Smokeless tobacco: Never  ?Substance and Sexual Activity  ? Alcohol use: Not Currently  ? Drug use: Not Currently  ? ?Objective:  ? ?Vitals:  ?11/18/21 1017  ?BP: 120/78  ?Pulse: 86   ?Weight: 60.2 kg (132 lb 12.8 oz)  ?Height: 157.5 cm (_0 )  ? ?Body mass index is 24.29 kg/m?. ? ?Physical Exam ?Vitals reviewed.  ?Constitutional:  ?Appearance: Normal appearance.  ?Chest:  ?Breasts: ?Right: No inverted nipple, mass or nipple discharge.  ?Left: No inverted nipple, mass or nipple discharge.  ?Lymphadenopathy:  ?Upper Body:  ?Right upper body: No supraclavicular or axillary adenopathy.  ?Left upper body: No supraclavicular or axillary adenopathy.  ?Neurological:  ?Mental Status: She is alert.  ? ? ? ?Assessment and Plan:  ? ?Malignant neoplasm of upper-inner quadrant of right breast in female, estrogen receptor positive (CMS-HCC) ? ?Right breast seed guided lumpectomy, right axillary sn biopsy ? ?We discussed the staging and pathophysiology of breast cancer. We discussed all of the different options for treatment for breast cancer including surgery, chemotherapy, radiation therapy, Herceptin, and antiestrogen therapy. ?We discussed a sentinel lymph node biopsy as she does not appear to having lymph node involvement right now. We discussed the performance of that with injection of radioactive tracer. We discussed that there is a chance of having a positive node with a sentinel lymph node biopsy and we will await the permanent pathology to make any other first further decisions in terms of her treatment. We discussed up to a 5% risk lifetime of chronic shoulder pain as well as lymphedema associated with a sentinel lymph node biopsy. ?We discussed the options for treatment of the  breast cancer which included lumpectomy versus a mastectomy. We discussed the performance of the lumpectomy with radioactive seed placement. We discussed a 5-10% chance of a positive margin requiring reexcision in the operating room. We also discussed that she will likely need radiation therapy if she undergoes lumpectomy. We discussed mastectomy and the postoperative care for that as well. Mastectomy can be followed by  reconstruction. The decision for lumpectomy vs mastectomy has no impact on decision for chemotherapy. Most mastectomy patients will not need radiation therapy. We discussed that there is no difference in her survival whether she undergoes lumpectomy with radiation therapy or antiestrogen therapy versus a mastectomy. There is also no real difference between her recurrence in the breast. ?We discussed the risks of operation including bleeding, infection, possible reoperation. She understands her further therapy will be based on what her stages at the time of her operation. ? ?I reviewed her pathology today. I also reviewed her mammogram and ultrasound independently and concur with the findings. We made a decision to proceed with surgery today. ?

## 2021-12-12 NOTE — Transfer of Care (Signed)
Immediate Anesthesia Transfer of Care Note ? ?Patient: [name] ? ?Procedure(s) Performed: RIGHT BREAST LUMPECTOMY WITH RADIOACTIVE SEED AND RIGHT AXILLARY SENTINEL LYMPH NODE BIOPSY (Right: Breast) ? ?Patient Location: PACU ? ?Anesthesia Type:GA combined with regional for post-op pain ? ?Level of Consciousness: awake, alert  and oriented ? ?Airway & Oxygen Therapy: Patient Spontanous Breathing and Patient connected to face mask oxygen ? ?Post-op Assessment: Report given to RN and Post -op Vital signs reviewed and stable ? ?Post vital signs: Reviewed and stable ? ?Last Vitals:  ?Vitals Value Taken Time  ?BP 102/61 12/12/21 0956  ?Temp    ?Pulse 65 12/12/21 0956  ?Resp    ?SpO2 99 % 12/12/21 0956  ?Vitals shown include unvalidated device data. ? ?Last Pain:  ?Vitals:  ? 12/12/21 0734  ?TempSrc: Oral  ?PainSc: 0-No pain  ?   ? ?Patients Stated Pain Goal: 3 (12/12/21 0734) ? ?Complications: No notable events documented. ?

## 2021-12-13 ENCOUNTER — Encounter (HOSPITAL_BASED_OUTPATIENT_CLINIC_OR_DEPARTMENT_OTHER): Payer: Self-pay | Admitting: General Surgery

## 2021-12-13 NOTE — Addendum Note (Signed)
Addendum  created 12/13/21 1527 by Gladyes Kudo, Ernesta Amble, CRNA  ? Charge Capture section accepted  ?  ?

## 2021-12-15 LAB — SURGICAL PATHOLOGY

## 2021-12-28 DIAGNOSIS — I10 Essential (primary) hypertension: Secondary | ICD-10-CM | POA: Diagnosis not present

## 2021-12-28 DIAGNOSIS — Z853 Personal history of malignant neoplasm of breast: Secondary | ICD-10-CM | POA: Diagnosis not present

## 2022-01-01 NOTE — Therapy (Signed)
?OUTPATIENT PHYSICAL THERAPY BREAST CANCER POST OP FOLLOW UP ? ? ?Patient Name: [name] ?MRN: [id number] ?DOB:November 16, 1950, 71 y.o., female ?Today's Date: 01/02/2022 ? ? PT End of Session - 01/02/22 1136   ? ? Visit Number 2   ? Number of Visits 2   ? Date for PT Re-Evaluation 01/07/22   ? PT Start Time 1104   ? PT Stop Time 1135   ? PT Time Calculation (min) 31 min   ? Activity Tolerance Patient tolerated treatment well   ? Behavior During Therapy [facility] for tasks assessed/performed   ? ?  ?  ? ?  ? ? ?Past Medical History:  ?Diagnosis Date  ? Abnormal Pap smear   ? Arthritis   ? Colon polyps   ? Hx of seasonal allergies   ? Hypertension   ? Postmenopausal HRT (hormone replacement therapy) 11/14/2012  ? Vaginal Pap smear, abnormal   ? ?Past Surgical History:  ?Procedure Laterality Date  ? BREAST LUMPECTOMY WITH RADIOACTIVE SEED AND SENTINEL LYMPH NODE BIOPSY Right 12/12/2021  ? Procedure: RIGHT BREAST LUMPECTOMY WITH RADIOACTIVE SEED AND RIGHT AXILLARY SENTINEL LYMPH NODE BIOPSY;  Surgeon: Rolm Bookbinder, MD;  Location: [facility];  Service: General;  Laterality: Right;  ? COLONOSCOPY N/A 04/15/2014  ? Procedure: COLONOSCOPY;  Surgeon: Daneil Dolin, MD;  [facility];  Service: Endoscopy;  Laterality: N/A;  9:30 AM  ? COLONOSCOPY N/A 06/06/2017  ? Procedure: COLONOSCOPY;  Surgeon: Daneil Dolin, MD;  [facility];  Service: Endoscopy;  Laterality: N/A;  8:30 AM  ? COLONOSCOPY WITH PROPOFOL N/A 01/13/2021  ? Procedure: COLONOSCOPY WITH PROPOFOL;  Surgeon: Daneil Dolin, MD;  [facility];  Service: Endoscopy;  Laterality: N/A;  am  ? LT KNEE SURGERY    ? POLYPECTOMY  06/06/2017  ? Procedure: POLYPECTOMY;  Surgeon: Daneil Dolin, MD;  [facility];  Service: Endoscopy;;  Ascending colon x2 (HS)  ? POLYPECTOMY  01/13/2021  ? Procedure: POLYPECTOMY INTESTINAL;  Surgeon: Daneil Dolin, MD;  [facility];  Service: Endoscopy;;  ? SHOULDER  SURGERY    ? thumb surger    ? WRIST SURGERY    ? ?Patient Active Problem List  ? Diagnosis Date Noted  ? Invasive lobular carcinoma of right breast in female [facility]) 11/22/2021  ? Hypertension, essential 11/18/2021  ? 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 Rectocele 11/27/2016  ? Postmenopausal HRT (hormone replacement therapy) 11/14/2012  ? Arthritis 11/14/2012  ? ? ?PCP: Asencion Noble, MD ? ?REFERRING PROVIDER: Dr. Donne Hazel  ? ?REFERRING DIAG: Rt breat cancer ? 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 ?Abnormal posture ? ?Aftercare following surgery for neoplasm ? ?ONSET DATE: 11/19/21 ? ?SUBJECTIVE:                                                                                                                                                                                          ? ?  SUBJECTIVE STATEMENT: ?I had to get fluid to drained from the armpit. I think it is coming back. Wearing a normal sports bra.  I am using the pillow  ? ?PERTINENT HISTORY:  ?Patient was diagnosed with Stage Ia invasive lobular breast cancer. ER/PR positive, HER2 negative. Rt lumpectomy and SLNB 0/4 LN positive with Dr. Donne Hazel on 12/12/21 followed by XRT? and antiestrogen therapy. 70cc drained from axillary seroma on 12/27/21 ? ?PATIENT GOALS:  Reassess how my recovery is going related to arm function, pain, and swelling. ? ?PAIN:  ?Are you having pain? No ?Intermittent tingles and twinges  ? ?PRECAUTIONS: Recent Surgery, right UE Lymphedema risk,  ? ?ACTIVITY LEVEL / LEISURE: reports back to normal  ? ? ?OBJECTIVE:  ? ?PATIENT SURVEYS:  ?QUICK DASH: ?9% ? ?OBSERVATIONS: ?No cording, incisions healed, sports bra hitting below seroma / incision on the Rt so pt was encouraged to return to compression bra with use of pillow and also made pt a foam rectangle axillary pad to use in a vertical position in the sports bra. Seroma does not seem to have fluid but has hard edges around the size of a grape.    ? ?POSTURE:  ?Rounded shoulders, forward head ? ?LYMPHEDEMA ASSESSMENT:  ? ?UPPER EXTREMITY AROM/PROM: ?  ?A/PROM RIGHT  11/24/2021 ?  01/02/22  ?Shoulder extension 60 60  ?Shoulder flexion 150 155  ?Shoulder abduction 150 165  ?Shoulder internal rotation     ?Shoulder external rotation 90 100  ?                        (Blank rows = not tested) ?   ?  ?LYMPHEDEMA ASSESSMENTS:  ?  ?[facility] RIGHT  11/24/2021 01/02/22  ?10 cm proximal to olecranon process 27 27  ?Olecranon process 24.5 24.5  ?10 cm proximal to ulnar styloid process 20.5 20.5  ?Just proximal to ulnar styloid process 14.5 14.5  ?Across hand at thumb web space 28 28  ?At base of 2nd digit 6.6 6.6  ?(Blank rows = not tested) ?  ?LANDMARK LEFT  ?10 cm proximal to olecranon process    ?Olecranon process    ?10 cm proximal to ulnar styloid process    ?Just proximal to ulnar styloid process    ?Across hand at thumb web space    ?At base of 2nd digit    ?(Blank rows = not tested) ? ? ?PATIENT EDUCATION:  ?Education details: education on NLN risk reduction as pt is unable to do ABC class virtually, seroma care using comrpession bra and foam pads, when to return to therapy, and other details per below.  ?Person educated: Patient ?Education method: Explanation, Demonstration, and Handouts ?Education comprehension: verbalized understanding ? ? ?HOME EXERCISE PROGRAM: ? Reviewed previously given post op HEP to continue throughout radiation and after ? ? ?ASSESSMENT: ? ?CLINICAL IMPRESSION: ?Pt has returned to at least baseline AROM of the shoulder and has no activitiy limitations.  Pt did have a seroma in the axilla which was drained.  It does not seem to have more fluid at this point but some hard edges.  Gave pt foam and instruction on using this and pillow with higher up compression bra.   ? ?Pt will benefit from skilled therapeutic intervention to improve on the following deficits: Decreased knowledge of precautions, impaired UE functional use, pain, decreased ROM,  postural dysfunction.  ? ?PT treatment/interventions: ADL/Self care home management, Patient/Family education ? ? ?GOALS: ?Goals reviewed with patient? Yes ? ?  LONG TERM GOALS:  (STG=LTG) ? ?GOALS Name Target Date ?(Remove Blue Hyperlink) Goal status  ?1 Pt will demonstrate she has regained full shoulder ROM and function post operatively compared to baselines.  ?Baseline: 01/02/22 MET  ?2 Pt will be educated on risk reduction for lymphedema 01/02/22 MET  ?3 Pt will be scheduled for SOZO surveillance 01/02/22 MET  ?     ? ? ? ?PLAN: ?PT FREQUENCY/DURATION: SOZO screens only ? ?PLAN FOR NEXT SESSION: SOZO screens until at least 12/13/23 ? ? ?[facility] ? [address], [address] ? [place] [address] ? [phone] ? ? ?Scar massage ?You can begin gentle scar massage to you incision sites. Gently place one hand on the incision and move the skin (without sliding on the skin) in various directions. Do this for a few minutes and then you can gently massage either coconut oil or vitamin E cream into the scars. ? ?Compression garment ?You should continue wearing your compression bra until you feel like you no longer have swelling. ? ?Home exercise Program ?Continue doing the exercises you were given until you feel like you can do them without feeling any tightness at the end.  ? ?Walking Program ?Studies show that 30 minutes of walking per day (fast enough to elevate your heart rate) can significantly reduce the risk of a cancer recurrence. If you can't walk due to other medical reasons, we encourage you to find another activity you could do (like a stationary bike or water exercise). ? ?Posture ?After breast cancer surgery, people frequently sit with rounded shoulders posture because it puts their incisions on slack and feels better. If you sit like this and scar tissue forms in that position, you can become very tight and have pain sitting or standing with good posture. Try to be aware of your posture  and sit and stand up tall to heal properly. ? ?Follow up PT: ?It is recommended you return every 3 months for the first 3 years following surgery to be assessed on the SOZO machine for an L-Dex score. Terie Purser

## 2022-01-02 ENCOUNTER — Ambulatory Visit: Payer: Medicare Other | Attending: General Surgery | Admitting: Rehabilitation

## 2022-01-02 ENCOUNTER — Encounter: Payer: Self-pay | Admitting: Rehabilitation

## 2022-01-02 DIAGNOSIS — C50911 Malignant neoplasm of unspecified site of right female breast: Secondary | ICD-10-CM | POA: Diagnosis not present

## 2022-01-02 DIAGNOSIS — R293 Abnormal posture: Secondary | ICD-10-CM | POA: Diagnosis not present

## 2022-01-02 DIAGNOSIS — Z483 Aftercare following surgery for neoplasm: Secondary | ICD-10-CM | POA: Diagnosis not present

## 2022-01-02 NOTE — Patient Instructions (Signed)
[place], [address] ? [place] [address] ? [phone] ? ?Scar massage ?You can begin gentle scar massage to you incision sites. Gently place one hand on the incision and move the skin (without sliding on the skin) in various directions. Do this for a few minutes and then you can gently massage either coconut oil or vitamin E cream into the scars. ? ?Compression garment ?You should continue wearing your compression bra until you feel like you no longer have swelling. ? ?Home exercise Program ?Continue doing the exercises you were given until you feel like you can do them without feeling any tightness at the end.  ? ?Walking Program ?Studies show that 30 minutes of walking per day (fast enough to elevate your heart rate) can significantly reduce the risk of a cancer recurrence. If you can't walk due to other medical reasons, we encourage you to find another activity you could do (like a stationary bike or water exercise). ? ?Posture ?After breast cancer surgery, people frequently sit with rounded shoulders posture because it puts their incisions on slack and feels better. If you sit like this and scar tissue forms in that position, you can become very tight and have pain sitting or standing with good posture. Try to be aware of your posture and sit and stand up tall to heal properly. ? ?Follow up PT: ?It is recommended you return every 3 months for the first 3 years following surgery to be assessed on the SOZO machine for an L-Dex score. This helps prevent clinically significant lymphedema in 95% of patients. These follow up screens are 10 minute appointments that you are not billed for. ?

## 2022-01-09 ENCOUNTER — Inpatient Hospital Stay (HOSPITAL_COMMUNITY): Payer: Medicare Other | Attending: Hematology | Admitting: Hematology

## 2022-01-09 VITALS — BP 146/62 | HR 75 | Temp 98.3°F | Resp 16 | Ht 62.0 in | Wt 132.0 lb

## 2022-01-09 DIAGNOSIS — C50211 Malignant neoplasm of upper-inner quadrant of right female breast: Secondary | ICD-10-CM | POA: Diagnosis not present

## 2022-01-09 DIAGNOSIS — H43393 Other vitreous opacities, bilateral: Secondary | ICD-10-CM | POA: Diagnosis not present

## 2022-01-09 DIAGNOSIS — C50911 Malignant neoplasm of unspecified site of right female breast: Secondary | ICD-10-CM | POA: Diagnosis not present

## 2022-01-09 DIAGNOSIS — E559 Vitamin D deficiency, unspecified: Secondary | ICD-10-CM

## 2022-01-09 DIAGNOSIS — Z170 Estrogen receptor positive status [ER+]: Secondary | ICD-10-CM | POA: Diagnosis not present

## 2022-01-09 DIAGNOSIS — M8580 Other specified disorders of bone density and structure, unspecified site: Secondary | ICD-10-CM | POA: Insufficient documentation

## 2022-01-09 MED ORDER — ANASTROZOLE 1 MG PO TABS: 1.0000 mg | ORAL_TABLET | Freq: Every day | ORAL | 6 refills | Status: DC

## 2022-01-09 NOTE — Progress Notes (Signed)
Labs ordered and Anastrozole sent in per Dr. Tomie China orders.

## 2022-01-09 NOTE — Patient Instructions (Addendum)
[place] at [facility] Discharge Instructions  You were seen and examined today by Dr. Delton Coombes.  Dr. Delton Coombes discussed your results of your lymph node biopsies and breast tissue. Dr. Delton Coombes recommends seeing a radiation doctor to see if they think that radiation would be beneficial or not. He is starting you on Anastrozole to be taken once daily. Continue taking over the counter Vitamin D 1000 mg and over the counter Calcium.   Continue yearly mammograms and bone density test every two years.  Follow-up as scheduled in 4 months.    Thank you for choosing [place] at [facility] to provide your oncology and hematology care.  To afford each patient quality time with our provider, please arrive at least 15 minutes before your scheduled appointment time.   If you have a lab appointment with the [facility] please come in thru the Main Entrance and check in at the main information desk.  You need to re-schedule your appointment should you arrive 10 or more minutes late.  We strive to give you quality time with our providers, and arriving late affects you and other patients whose appointments are after yours.  Also, if you no show three or more times for appointments you may be dismissed from the clinic at the providers discretion.     Again, thank you for choosing [facility].  Our hope is that these requests will decrease the amount of time that you wait before being seen by our physicians.       _____________________________________________________________  Should you have questions after your visit to [facility], please contact our office at [phone] and follow the prompts.  Our office hours are 8:00 a.m. and 4:30 p.m. Monday - Friday.  Please note that voicemails left after 4:00 p.m. may not be returned until the following business day.  We are closed weekends and major holidays.  You do have access to  a nurse 24-7, just call the main number to the clinic [phone] and do not press any options, hold on the line and a nurse will answer the phone.    For prescription refill requests, have your pharmacy contact our office and allow 72 hours.    Due to Covid, you will need to wear a mask upon entering the hospital. If you do not have a mask, a mask will be given to you at the Main Entrance upon arrival. For doctor visits, patients may have 1 support person age 77 or older with them. For treatment visits, patients can not have anyone with them due to social distancing guidelines and our immunocompromised population.

## 2022-01-09 NOTE — Progress Notes (Signed)
[name], [name]   Patient Care Team: Asencion Noble, MD as PCP - General Rourk, Cristopher Estimable, MD as Consulting Physician (Gastroenterology) Derek Jack, MD as Medical Oncologist (Medical Oncology) Brien Mates, RN as Oncology Nurse Navigator (Medical Oncology)  SUMMARY OF ONCOLOGIC HISTORY: Oncology History   No history exists.    CHIEF COMPLIANT: Follow-up of right breast cancer   INTERVAL HISTORY: [name] is a 71 y.o. female here today for follow up of her right breast cancer. Her last visit was on 11/23/2021.   Today she reports feeling good.   REVIEW OF SYSTEMS:   Review of Systems  Constitutional:  Negative for appetite change and fatigue.  All other systems reviewed and are negative.  I have reviewed the past medical history, past surgical history, social history and family history with the patient and they are unchanged from previous note.   ALLERGIES:   has No Known Allergies.   MEDICATIONS:  Current Outpatient Medications  Medication Sig Dispense Refill   acetaminophen (TYLENOL) 500 MG tablet Take 500 mg by mouth as needed for mild pain. Only as needed     Calcium Carbonate-Vitamin D (CALCIUM 600 + D PO) Take 1 tablet by mouth daily.     cetirizine (ZYRTEC) 10 MG tablet Take 10 mg by mouth daily as needed for allergies. As needed     Multiple Vitamins-Minerals (ONE-A-DAY WOMENS 50 PLUS PO) Take 1 tablet by mouth daily.     oxyCODONE (OXY IR/ROXICODONE) 5 MG immediate release tablet Take 1 tablet (5 mg total) by mouth every 6 (six) hours as needed. 10 tablet 0   valsartan (DIOVAN) 80 MG tablet Take 80 mg by mouth at bedtime.     No current facility-administered medications for this visit.     PHYSICAL EXAMINATION: Performance status (ECOG): 1 - Symptomatic but completely ambulatory  There were no vitals filed for this visit. Wt Readings from Last 3 Encounters:  12/12/21 130 lb 4.7 oz  (59.1 kg)  11/24/21 132 lb (59.9 kg)  11/23/21 130 lb 8.2 oz (59.2 kg)   Physical Exam Vitals reviewed.  Constitutional:      Appearance: Normal appearance.  Cardiovascular:     Rate and Rhythm: Normal rate and regular rhythm.     Pulses: Normal pulses.     Heart sounds: Normal heart sounds.  Pulmonary:     Effort: Pulmonary effort is normal.     Breath sounds: Normal breath sounds.  Neurological:     General: No focal deficit present.     Mental Status: She is alert and oriented to person, place, and time.  Psychiatric:        Mood and Affect: Mood normal.        Behavior: Behavior normal.    Breast Exam Chaperone: Thana Ates     LABORATORY DATA:  I have reviewed the data as listed     View : No data to display.         No results found for: UEA540 Lab Results  Component Value Date   HGB 13.0 05/22/2011    ASSESSMENT:  Stage Ia (T1BN0G1) right invasive lobular breast cancer, ER/PR positive, HER2 negative: - Screening mammogram on 09/23/2021 with distortion in the right breast. - Diagnostic mammogram on 10/11/2021 with suspicious right breast mass with spiculated margins at 1 o'clock position measuring 8 x 8 x 5 mm.  Right axilla demonstrates few borderline lymph nodes with nodular  areas of cortical thickening up to 4-5 mm. - Right breast 1:00 biopsy and right axillary lymph node biopsy on 10/18/2021 - Pathology: Invasive lobular carcinoma, grade 1, E-cadherin negative.  HER2 0, ER 60%, PR 50%, Ki-67 2%.  Right axilla lymph node with no metastatic carcinoma identified. - Lumpectomy by Dr. Donne Hazel on 12/12/2021. - Pathology: 0.8 cm invasive lobular carcinoma, grade 2, margins negative.  0/4 sentinel lymph nodes involved.  ER 60% strong positive, PR 60% strong positive, HER2 negative, IHC 0, Ki-67 2%. - Anastrozole started on 01/09/2022.   Social/history: - Lives at home with her husband.  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  Non-smoker. - Mother and sister had colon  cancers.   PLAN:  Stage Ia (T1BN0G2) right breast ILC, ER/PR positive, HER2 negative: - She had a lumpectomy by Dr. Donne Hazel. - We discussed the pathology report in detail. - I have recommended antiestrogen therapy with anastrozole for at least 5 years.  We discussed side effects in detail. - She will follow-up with Dr. Sondra Come. - RTC 4 months for follow-up.  2.  Osteopenia: - DEXA scan on 06/21/2021 with T score -1.9. - Continue calcium and vitamin D supplements. - Plan to repeat DEXA scan in 2 years.  Breast Cancer therapy associated bone loss: I have recommended calcium, Vitamin D and weight bearing exercises.  Orders placed this encounter:  No orders of the defined types were placed in this encounter.   The patient has a good understanding of the overall plan. She agrees with it. She will call with any problems that may develop before the next visit here.  Derek Jack, MD [place] [phone]   I, Thana Ates, am acting as a scribe for Dr. Derek Jack.  ,I, Derek Jack MD, have reviewed the above documentation for accuracy and completeness, and I agree with the above.

## 2022-01-22 NOTE — Progress Notes (Signed)
Radiation Oncology         [phone] ________________________________  Name: [name] MRN: [id number]  Date: 01/23/2022  DOB: 1950-11-25  Re-Evaluation Note  CC: Asencion Noble, MD  Derek Jack, MD  No diagnosis found.  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 She was seen in consultation on 11/24/21.   She opted to proceed with right breast lumpectomy with nodal biopsies on 12/12/21 under the care of Dr. Donne Hazel. Pathology from the procedure revealed: grade 2 invasive lobular carcinoma measuring 0.8 cm. All margins negative for carcinoma. Nodal status of 4/4 right axillary sentinel lymph node excisions negative for carcinoma. Prognostic indicators significant for: estrogen receptor 60% positive and progesterone receptor 60% positive, both with strong staining intensity; Proliferation marker Ki67 at 2%; Her2 status negative; Grade 2.   During a post-op follow up with Dr. Donne Hazel on 12/27/21, the patient was noted to have a right axillary seroma. This was aspirated which yielded 70 cc's of fluid.   The patient recently met with Dr. Delton Coombes on 01/09/22. Antiestrogen treatment options were discussed with the patient, and after review of the risks and benefits, the patient agreed to proceed with anastrozole x 5 years following XRT.   On review of systems, the patient reports ***. She denies *** and any other symptoms.    Allergies:  has No Known Allergies.  Meds: Current Outpatient Medications  Medication Sig Dispense Refill   acetaminophen (TYLENOL)  500 MG tablet Take 500 mg by mouth as needed for mild pain. Only as needed (Patient not taking: Reported on 01/09/2022)     anastrozole (ARIMIDEX) 1 MG tablet Take 1 tablet (1 mg total) by mouth daily. 90 tablet 6   Calcium Carbonate-Vitamin D (CALCIUM 600 + D PO) Take 1 tablet by mouth daily.     cetirizine (ZYRTEC) 10 MG tablet Take 10 mg by mouth daily as needed for allergies. As needed (Patient not taking: Reported on 01/09/2022)     Multiple Vitamins-Minerals (ONE-A-DAY WOMENS 50 PLUS PO) Take 1 tablet by mouth daily.     valsartan (DIOVAN) 80 MG tablet Take 80 mg by mouth at bedtime.     No current facility-administered medications for this encounter.    Physical Findings: The patient is in no acute distress. Patient is alert and oriented.  vitals were not taken for this visit.  No significant changes. Lungs are clear to auscultation bilaterally. Heart has regular rate and rhythm. No palpable cervical, supraclavicular, or axillary adenopathy. Abdomen soft, non-tender, normal bowel sounds. Left Breast: no palpable mass, nipple discharge or bleeding. Right Breast: ***  Lab Findings: Lab Results  Component Value Date   HGB 13.0 05/22/2011    Radiographic Findings: No results found.  Impression:  S/p lumpectomy: Right Breast UIQ, Invasive Lobular Carcinoma, ER+ / PR+ / Her2-, Grade 2  ***  Plan:  Patient is scheduled for CT simulation {date/later today}. ***  -----------------------------------  Blair Promise, PhD, MD  This document serves as a record of services personally performed by Gery Pray, MD. It was created on his behalf by Roney Mans, a trained  medical scribe. The creation of this record is based on the scribe's personal observations and the provider's statements to them. This document has been checked and approved by the attending provider.

## 2022-01-23 ENCOUNTER — Ambulatory Visit
Admission: RE | Admit: 2022-01-23 | Discharge: 2022-01-23 | Disposition: A | Discharge status: Home / Self Care | Payer: Medicare Other | Source: Ambulatory Visit | Attending: Radiation Oncology | Admitting: Radiation Oncology

## 2022-01-23 ENCOUNTER — Ambulatory Visit: Payer: Medicare Other | Admitting: Radiation Oncology

## 2022-01-23 ENCOUNTER — Other Ambulatory Visit: Payer: Self-pay

## 2022-01-23 VITALS — BP 159/64 | HR 72 | Temp 97.0°F | Resp 18 | Ht 62.0 in | Wt 130.4 lb

## 2022-01-23 DIAGNOSIS — Z79811 Long term (current) use of aromatase inhibitors: Secondary | ICD-10-CM | POA: Insufficient documentation

## 2022-01-23 DIAGNOSIS — C50411 Malignant neoplasm of upper-outer quadrant of right female breast: Secondary | ICD-10-CM | POA: Diagnosis not present

## 2022-01-23 DIAGNOSIS — C50211 Malignant neoplasm of upper-inner quadrant of right female breast: Secondary | ICD-10-CM | POA: Diagnosis not present

## 2022-01-23 DIAGNOSIS — C50911 Malignant neoplasm of unspecified site of right female breast: Secondary | ICD-10-CM

## 2022-01-23 DIAGNOSIS — Z170 Estrogen receptor positive status [ER+]: Secondary | ICD-10-CM | POA: Diagnosis not present

## 2022-01-23 NOTE — Progress Notes (Addendum)
Location of Breast Cancer: upper-inner quadrant of right breast     A. BREAST, RIGHT, LUMPECTOMY:  -  Invasive lobular carcinoma, Nottingham grade 2 of 3, 0.8 cm  -  Margins uninvolved by carcinoma (0.7 cm; posterior margin)  -  Previous biopsy site changes present  -  See oncology table below   B. LYMPH NODE, RIGHT AXILLARY, SENTINEL, EXCISION:  -  No carcinoma identified in one lymph node (0/1)  -  See comment   C. LYMPH NODE, RIGHT AXILLARY, SENTINEL, EXCISION:  -  No carcinoma identified in one lymph node (0/1)  -  See comment   D. LYMPH NODE, RIGHT AXILLARY, SENTINEL, EXCISION:  -  No carcinoma identified in one lymph node (0/1)  -  See comment   E. LYMPH NODE, RIGHT AXILLARY, SENTINEL, EXCISION:  -  No carcinoma identified in one lymph node (0/1)  -  See comment   F. BREAST, RIGHT ADDITIONAL SUPERIOR MARGIN, EXCISION:  -  Sclerosing adenosis  -  No residual carcinoma identified  -  See comment   G. BREAST, RIGHT ADDITIONAL MEDIAL MARGIN, EXCISION:  -  No residual carcinoma identified  -  See comment   Did patient present with symptoms (if so, please note symptoms) or was this found on screening mammography?: screening mammogram   Past/Anticipated interventions by surgeon, if any:  Right breast seed guided lumpectomy, right axillary sn biopsy   Past/Anticipated interventions by medical oncology, if any: antiestrogen therapy with anastrozole for at least 5 years - started this week.   Lymphedema issues, if any:  no     Pain issues, if any:  Occasiona lshooting pains in right breast.    SAFETY ISSUES: Prior radiation? no Pacemaker/ICD? no Possible current pregnancy?no, postmenopausal Is the patient on methotrexate? no   Current Complaints / other details:  None  BP (!) 159/64 (BP Location: Left Arm, Patient Position: Sitting)   Pulse 72   Temp (!) 97 F (36.1 C) (Temporal)   Resp 18   Ht '5\' 2"'$  (1.575 m)   Wt 130 lb 6 oz (59.1 kg)   SpO2 99%   BMI 23.85  kg/m

## 2022-01-26 ENCOUNTER — Inpatient Hospital Stay (HOSPITAL_COMMUNITY): Payer: Medicare Other | Attending: Hematology | Admitting: Licensed Clinical Social Worker

## 2022-01-26 ENCOUNTER — Encounter (HOSPITAL_COMMUNITY): Payer: Self-pay | Admitting: Licensed Clinical Social Worker

## 2022-01-26 DIAGNOSIS — C50911 Malignant neoplasm of unspecified site of right female breast: Secondary | ICD-10-CM

## 2022-01-26 DIAGNOSIS — Z800 Family history of malignant neoplasm of digestive organs: Secondary | ICD-10-CM

## 2022-01-26 NOTE — Progress Notes (Signed)
REFERRING PROVIDER: Derek Jack, MD [address] [place],  [place] [address]  PRIMARY PROVIDER:  Asencion Noble, MD  PRIMARY REASON FOR VISIT:  1. 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 Family history of colon cancer      HISTORY OF PRESENT ILLNESS:   [name], a 71 y.o. female, was seen for a [facility] cancer genetics consultation at the request of Dr. Delton Coombes due to a personal and family history of cancer.  [name] presents to clinic today to discuss the possibility of a hereditary predisposition to cancer, genetic testing, and to further clarify her future cancer risks, as well as potential cancer risks for family members.   In 2023, at the age of 76, [name] was diagnosed with invasive lobular carcinoma of the right breast, ER/PR+, HER2-. The treatment plan included lumpectomy and anastrozole.    CANCER HISTORY:  Oncology History   No history exists.     RISK FACTORS:  Menarche was at age 49.  First live birth at age 19.  OCP use for approximately 6 years.  Ovaries intact: yes.  Hysterectomy: no.  Menopausal status: postmenopausal.  HRT use: 1 years. Colonoscopy: yes;  few polyps . Mammogram within the last year: yes. Number of breast biopsies: 0.  Past Medical History:  Diagnosis Date   Abnormal Pap smear    Arthritis    Colon polyps    Hx of seasonal allergies    Hypertension    Postmenopausal HRT (hormone replacement therapy) 11/14/2012   Vaginal Pap smear, abnormal     Past Surgical History:  Procedure Laterality Date   BREAST LUMPECTOMY WITH RADIOACTIVE SEED AND SENTINEL LYMPH NODE BIOPSY Right 12/12/2021   Procedure: RIGHT BREAST LUMPECTOMY WITH RADIOACTIVE SEED AND RIGHT AXILLARY SENTINEL LYMPH NODE BIOPSY;  Surgeon: Rolm Bookbinder, MD;  Location: [place];  Service: General;  Laterality: Right;   COLONOSCOPY N/A 04/15/2014   Procedure: COLONOSCOPY;  Surgeon: Daneil Dolin, MD;  [facility];  Service: Endoscopy;  Laterality: N/A;  9:30 AM   COLONOSCOPY N/A 06/06/2017   Procedure: COLONOSCOPY;  Surgeon: Daneil Dolin, MD;  [facility];  Service: Endoscopy;  Laterality: N/A;  8:30 AM   COLONOSCOPY WITH PROPOFOL N/A 01/13/2021   Procedure: COLONOSCOPY WITH PROPOFOL;  Surgeon: Daneil Dolin, MD;  [facility];  Service: Endoscopy;  Laterality: N/A;  am   LT KNEE SURGERY     POLYPECTOMY  06/06/2017   Procedure: POLYPECTOMY;  Surgeon: Daneil Dolin, MD;  [facility];  Service: Endoscopy;;  Ascending colon x2 (HS)   POLYPECTOMY  01/13/2021   Procedure: POLYPECTOMY INTESTINAL;  Surgeon: Daneil Dolin, MD;  [facility];  Service: Endoscopy;;   SHOULDER SURGERY     thumb surger     WRIST SURGERY      FAMILY HISTORY:  We obtained a detailed, 4-generation family history.  Significant diagnoses are listed below: Family History  Problem Relation Age of Onset   Diabetes Mother    Colon cancer Mother 96   Colon cancer Sister        dx 69s d. over 45   Depression Sister    Arthritis Sister    Diabetes Maternal Aunt    [name] has 1 son, 68, and 1 daughter, 63. She had 2 maternal half sisters and 1 maternal half brother. One of her half sisters had colon cancer in her  32s and passed of it over age 36.   [name] mother had colon cancer as well, diagnosed around age 76 and is living at 65. No other known cancers on her maternal side.  [name] has no information about her paternal side of the family.  [name] is unaware of previous family history of genetic testing for hereditary cancer risks. There is no reported Ashkenazi Jewish ancestry. There is no known consanguinity.    GENETIC COUNSELING ASSESSMENT: [name] is a 71 y.o. female with a personal history of breast cancer and family history of colon cancer which is somewhat suggestive of a hereditary cancer syndrome and predisposition to cancer. We, therefore,  discussed and recommended the following at today's visit.   DISCUSSION: We discussed that approximately 10% of breast cancer is hereditary. Most cases of hereditary breast cancer are associated with BRCA1/BRCA2 genes, although there are other genes associated with hereditary cancer as well, including CHEK2 which is also associated with colon cancer. Cancers and risks are gene specific. We discussed that testing is beneficial for several reasons including knowing about cancer risks, identifying potential screening and risk-reduction options that may be appropriate, and to understand if other family members could be at risk for cancer and allow them to undergo genetic testing.   We reviewed the characteristics, features and inheritance patterns of hereditary cancer syndromes. We also discussed genetic testing, including the appropriate family members to test, the process of testing, insurance coverage and turn-around-time for results. We discussed the implications of a negative, positive and/or variant of uncertain significant result. We recommended [name] pursue genetic testing for the [facility] Common Hereditary Cancers+RNA gene panel.   Based on [name] personal and family history of cancer, she meets medical criteria for genetic testing. Despite that she meets criteria, she may still have an out of pocket cost. We discussed that if her out of pocket cost for testing is over $100, the laboratory will call and confirm whether she wants to proceed with testing.  If the out of pocket cost of testing is less than $100 she will be billed by the genetic testing laboratory.   PLAN: After considering the risks, benefits, and limitations, [name] provided informed consent to pursue genetic testing and the blood sample was sent to [facility] for analysis of the Common Hereditary Cancers+RNA panel. Results should be available within approximately 2-3 weeks' time, at which point they will be disclosed  by telephone to [name], as will any additional recommendations warranted by these results. [name] will receive a summary of her genetic counseling visit and a copy of her results once available. This information will also be available in Epic.   [name] questions were answered to her satisfaction today. Our contact information was provided should additional questions or concerns arise. Thank you for the referral and allowing [place] to share in the care of your patient.   Faith Rogue, MS, [facility] Genetic Counselor [email or web address] Phone: [phone]  The patient was seen for a total of 25 minutes in virtual genetic counseling.  Patient's husband Darryl was also present. Dr. Grayland Ormond was available for discussion regarding this case.   _______________________________________________________________________ For Office Staff:  Number of people involved in session: 2 Was an Intern/ student involved with case: no

## 2022-01-30 ENCOUNTER — Inpatient Hospital Stay (HOSPITAL_COMMUNITY): Payer: Medicare Other

## 2022-01-30 DIAGNOSIS — C50911 Malignant neoplasm of unspecified site of right female breast: Secondary | ICD-10-CM | POA: Diagnosis not present

## 2022-01-30 DIAGNOSIS — Z800 Family history of malignant neoplasm of digestive organs: Secondary | ICD-10-CM | POA: Diagnosis not present

## 2022-02-10 DIAGNOSIS — L042 Acute lymphadenitis of upper limb: Secondary | ICD-10-CM | POA: Diagnosis not present

## 2022-02-24 DIAGNOSIS — R599 Enlarged lymph nodes, unspecified: Secondary | ICD-10-CM | POA: Diagnosis not present

## 2022-02-27 ENCOUNTER — Encounter: Payer: Self-pay | Admitting: Licensed Clinical Social Worker

## 2022-02-27 ENCOUNTER — Telehealth: Payer: Self-pay | Admitting: Licensed Clinical Social Worker

## 2022-02-27 ENCOUNTER — Ambulatory Visit: Payer: Self-pay | Admitting: Licensed Clinical Social Worker

## 2022-02-27 DIAGNOSIS — Z1379 Encounter for other screening for genetic and chromosomal anomalies: Secondary | ICD-10-CM | POA: Insufficient documentation

## 2022-02-27 NOTE — Progress Notes (Signed)
HPI:  Ms. Slight was previously seen in the [facility] clinic due to a personal and family history of cancer and concerns regarding a hereditary predisposition to cancer. Please refer to our prior cancer genetics clinic note for more information regarding our discussion, assessment and recommendations, at the time. Ms. Merced recent genetic test results were disclosed to her, as were recommendations warranted by these results. These results and recommendations are discussed in more detail below.  CANCER HISTORY:  Oncology History   No history exists.    FAMILY HISTORY:  We obtained a detailed, 4-generation family history.  Significant diagnoses are listed below: Family History  Problem Relation Age of Onset   Diabetes Mother    Colon cancer Mother 64   Colon cancer Sister        dx 54s d. over 67   Depression Sister    Arthritis Sister    Diabetes Maternal Aunt    Ms. Barich has 1 son, 43, and 1 daughter, 33. She had 2 maternal half sisters and 1 maternal half brother. One of her half sisters had colon cancer in her 62s and passed of it over age 88.    Ms. Rouse mother had colon cancer as well, diagnosed around age 67 and is living at 4. No other known cancers on her maternal side.   Ms. Endo has no information about her paternal side of the family.   Ms. Schweigert is unaware of previous family history of genetic testing for hereditary cancer risks. There is no reported Ashkenazi Jewish ancestry. There is no known consanguinity.        GENETIC TEST RESULTS: Genetic testing reported out on 02/24/2022 through the [facility] Common Hereditary Cancers+RNA cancer panel found no pathogenic mutations.   The Common Hereditary Cancers Panel + RNA offered by [facility] includes sequencing and/or deletion duplication testing of the following 47 genes: APC, ATM, AXIN2, BARD1, BMPR1A, BRCA1, BRCA2, BRIP1, CDH1, CDKN2A (p14ARF), CDKN2A (p16INK4a), CKD4, CHEK2, CTNNA1, DICER1, EPCAM  (Deletion/duplication testing only), GREM1 (promoter region deletion/duplication testing only), KIT, MEN1, MLH1, MSH2, MSH3, MSH6, MUTYH, NBN, NF1, NHTL1, PALB2, PDGFRA, PMS2, POLD1, POLE, PTEN, RAD50, RAD51C, RAD51D, SDHB, SDHC, SDHD, SMAD4, SMARCA4. STK11, TP53, TSC1, TSC2, and VHL.  The following genes were evaluated for sequence changes only: SDHA and HOXB13 c.251G>A variant only.   The test report has been scanned into EPIC and is located under the Molecular Pathology section of the Results Review tab.  A portion of the result report is included below for reference.     We discussed that because current genetic testing is not perfect, it is possible there may be a gene mutation in one of these genes that current testing cannot detect, but that chance is small.  There could be another gene that has not yet been discovered, or that we have not yet tested, that is responsible for the cancer diagnoses in the family. It is also possible there is a hereditary cause for the cancer in the family that Ms. Vessey did not inherit and therefore was not identified in her testing.  Therefore, it is important to remain in touch with cancer genetics in the future so that we can continue to offer Ms. Palla the most up to date genetic testing.   ADDITIONAL GENETIC TESTING: We discussed with Ms. Jr that her genetic testing was fairly extensive.  If there are genes identified to increase cancer risk that can be analyzed in the future, we would be happy to discuss  and coordinate this testing at that time.    CANCER SCREENING RECOMMENDATIONS: Ms. Slavick test result is considered negative (normal).  This means that we have not identified a hereditary cause for her  personal and family history of cancer at this time. Most cancers happen by chance and this negative test suggests that her cancer may fall into this category.    While reassuring, this does not definitively rule out a hereditary predisposition to cancer. It  is still possible that there could be genetic mutations that are undetectable by current technology. There could be genetic mutations in genes that have not been tested or identified to increase cancer risk.  Therefore, it is recommended she continue to follow the cancer management and screening guidelines provided by her oncology and primary healthcare provider.   An individual's cancer risk and medical management are not determined by genetic test results alone. Overall cancer risk assessment incorporates additional factors, including personal medical history, family history, and any available genetic information that may result in a personalized plan for cancer prevention and surveillance.  RECOMMENDATIONS FOR FAMILY MEMBERS:  Relatives in this family might be at some increased risk of developing cancer, over the general population risk, simply due to the family history of cancer.  We recommended female relatives in this family have a yearly mammogram beginning at age 76, or 28 years younger than the earliest onset of cancer, an annual clinical breast exam, and perform monthly breast self-exams. Female relatives in this family should also have a gynecological exam as recommended by their primary provider.  All family members should be referred for colonoscopy starting at age 42.   FOLLOW-UP: Lastly, we discussed with Ms. Foister that cancer genetics is a rapidly advancing field and it is possible that new genetic tests will be appropriate for her and/or her family members in the future. We encouraged her to remain in contact with cancer genetics on an annual basis so we can update her personal and family histories and let her know of advances in cancer genetics that may benefit this family.   Our contact number was provided. Ms. Crocker questions were answered to her satisfaction, and she knows she is welcome to call us at anytime with additional questions or concerns.   Faith Rogue, MS, [facility] Genetic  Counselor [email or web address] Phone: [phone]

## 2022-02-27 NOTE — Telephone Encounter (Signed)
Revealed negative genetic testing.  This normal result is reassuring and indicates that it is unlikely [name]'s cancer is due to a hereditary cause.  It is unlikely that there is an increased risk of another cancer due to a mutation in one of these genes.  However, genetic testing is not perfect, and cannot definitively rule out a hereditary cause.  It will be important for her to keep in contact with genetics to learn if any additional testing may be needed in the future.

## 2022-04-03 ENCOUNTER — Ambulatory Visit: Payer: Medicare Other | Attending: General Surgery

## 2022-04-03 VITALS — Wt 128.4 lb

## 2022-04-03 DIAGNOSIS — Z483 Aftercare following surgery for neoplasm: Secondary | ICD-10-CM | POA: Insufficient documentation

## 2022-04-03 NOTE — Therapy (Signed)
OUTPATIENT PHYSICAL THERAPY SOZO SCREENING NOTE   Patient Name: [name] MRN: [id number] DOB:05/18/1951, 71 y.o., female Today's Date: 04/03/2022  PCP: Asencion Noble, MD REFERRING PROVIDER: Rolm Bookbinder, MD   PT End of Session - 04/03/22 1048     Visit Number 2   # unchanged due to screen only   PT Start Time 35    PT Stop Time 1050    PT Time Calculation (min) 4 min    Activity Tolerance Patient tolerated treatment well             Past Medical History:  Diagnosis Date   Abnormal Pap smear    Arthritis    Colon polyps    Hx of seasonal allergies    Hypertension    Postmenopausal HRT (hormone replacement therapy) 11/14/2012   Vaginal Pap smear, abnormal    Past Surgical History:  Procedure Laterality Date   BREAST LUMPECTOMY WITH RADIOACTIVE SEED AND SENTINEL LYMPH NODE BIOPSY Right 12/12/2021   Procedure: RIGHT BREAST LUMPECTOMY WITH RADIOACTIVE SEED AND RIGHT AXILLARY SENTINEL LYMPH NODE BIOPSY;  Surgeon: Rolm Bookbinder, MD;  Location: [place];  Service: General;  Laterality: Right;   COLONOSCOPY N/A 04/15/2014   Procedure: COLONOSCOPY;  Surgeon: Daneil Dolin, MD;  [facility];  Service: Endoscopy;  Laterality: N/A;  9:30 AM   COLONOSCOPY N/A 06/06/2017   Procedure: COLONOSCOPY;  Surgeon: Daneil Dolin, MD;  [facility];  Service: Endoscopy;  Laterality: N/A;  8:30 AM   COLONOSCOPY WITH PROPOFOL N/A 01/13/2021   Procedure: COLONOSCOPY WITH PROPOFOL;  Surgeon: Daneil Dolin, MD;  [facility];  Service: Endoscopy;  Laterality: N/A;  am   LT KNEE SURGERY     POLYPECTOMY  06/06/2017   Procedure: POLYPECTOMY;  Surgeon: Daneil Dolin, MD;  [facility];  Service: Endoscopy;;  Ascending colon x2 (HS)   POLYPECTOMY  01/13/2021   Procedure: POLYPECTOMY INTESTINAL;  Surgeon: Daneil Dolin, MD;  [facility];  Service: Endoscopy;;   SHOULDER SURGERY     thumb surger     WRIST  SURGERY     Patient Active Problem List   Diagnosis Date Noted   Genetic testing 02/27/2022   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 ER/PR positive, HER2 negative. Rt lumpectomy and SLNB 0/4 LN positive with Dr. Donne Hazel on 12/12/21 followed by XRT? and antiestrogen therapy. 70cc drained from axillary seroma on 12/27/21  PRECAUTIONS: right UE Lymphedema risk, None  SUBJECTIVE: Pt returns for her 3 month L-Dex screen.   PAIN:  Are you having pain? No  SOZO SCREENING: Patient was assessed today using the SOZO machine to determine the lymphedema index score. This was compared to her baseline score. It was determined that she is within the recommended range when compared to her baseline and no further action is needed at this time. She will continue SOZO screenings. These are done every 3 months for 2 years post operatively followed by every 6 months for 2 years, and then annually.   L-DEX FLOWSHEETS - 04/03/22 1000  L-DEX LYMPHEDEMA SCREENING   Measurement Type Unilateral    L-DEX MEASUREMENT EXTREMITY Upper Extremity    POSITION  Standing    DOMINANT SIDE Right    At Risk Side Right    BASELINE SCORE (UNILATERAL) 3.3    L-DEX SCORE (UNILATERAL) 5.6    VALUE CHANGE (UNILAT) 2.3              [name], PTA 04/03/2022, 10:51 AM

## 2022-05-11 DIAGNOSIS — Z23 Encounter for immunization: Secondary | ICD-10-CM | POA: Diagnosis not present

## 2022-05-16 ENCOUNTER — Inpatient Hospital Stay: Payer: Medicare Other | Attending: Hematology

## 2022-05-16 DIAGNOSIS — Z79811 Long term (current) use of aromatase inhibitors: Secondary | ICD-10-CM | POA: Insufficient documentation

## 2022-05-16 DIAGNOSIS — Z170 Estrogen receptor positive status [ER+]: Secondary | ICD-10-CM | POA: Diagnosis not present

## 2022-05-16 DIAGNOSIS — M8580 Other specified disorders of bone density and structure, unspecified site: Secondary | ICD-10-CM | POA: Diagnosis not present

## 2022-05-16 DIAGNOSIS — C50911 Malignant neoplasm of unspecified site of right female breast: Secondary | ICD-10-CM | POA: Diagnosis not present

## 2022-05-16 DIAGNOSIS — E559 Vitamin D deficiency, unspecified: Secondary | ICD-10-CM

## 2022-05-16 LAB — CBC WITH DIFFERENTIAL/PLATELET
Abs Immature Granulocytes: 0.01 10*3/uL (ref 0.00–0.07)
Basophils Absolute: 0.1 10*3/uL (ref 0.0–0.1)
Basophils Relative: 1 %
Eosinophils Absolute: 0.2 10*3/uL (ref 0.0–0.5)
Eosinophils Relative: 3 %
HCT: 39.4 % (ref 36.0–46.0)
Hemoglobin: 12.7 g/dL (ref 12.0–15.0)
Immature Granulocytes: 0 %
Lymphocytes Relative: 34 %
Lymphs Abs: 2.4 10*3/uL (ref 0.7–4.0)
MCH: 29.2 pg (ref 26.0–34.0)
MCHC: 32.2 g/dL (ref 30.0–36.0)
MCV: 90.6 fL (ref 80.0–100.0)
Monocytes Absolute: 0.5 10*3/uL (ref 0.1–1.0)
Monocytes Relative: 7 %
Neutro Abs: 3.8 10*3/uL (ref 1.7–7.7)
Neutrophils Relative %: 55 %
Platelets: 349 10*3/uL (ref 150–400)
RBC: 4.35 MIL/uL (ref 3.87–5.11)
RDW: 12.5 % (ref 11.5–15.5)
WBC: 6.9 10*3/uL (ref 4.0–10.5)
nRBC: 0 % (ref 0.0–0.2)

## 2022-05-16 LAB — COMPREHENSIVE METABOLIC PANEL
ALT: 19 U/L (ref 0–44)
AST: 21 U/L (ref 15–41)
Albumin: 4.2 g/dL (ref 3.5–5.0)
Alkaline Phosphatase: 83 U/L (ref 38–126)
Anion gap: 8 (ref 5–15)
BUN: 9 mg/dL (ref 8–23)
CO2: 27 mmol/L (ref 22–32)
Calcium: 10 mg/dL (ref 8.9–10.3)
Chloride: 104 mmol/L (ref 98–111)
Creatinine, Ser: 0.8 mg/dL (ref 0.44–1.00)
GFR, Estimated: >60 mL/min (ref >60)
Glucose, Bld: 135 mg/dL — ABNORMAL HIGH (ref 70–99)
Potassium: 4.2 mmol/L (ref 3.5–5.1)
Sodium: 139 mmol/L (ref 135–145)
Total Bilirubin: 0.4 mg/dL (ref 0.3–1.2)
Total Protein: 8 g/dL (ref 6.5–8.1)

## 2022-05-16 LAB — VITAMIN D 25 HYDROXY (VIT D DEFICIENCY, FRACTURES): Vit D, 25-Hydroxy: 47.61 ng/mL (ref 30–100)

## 2022-05-23 ENCOUNTER — Other Ambulatory Visit (HOSPITAL_COMMUNITY): Payer: Self-pay | Admitting: Hematology

## 2022-05-23 ENCOUNTER — Inpatient Hospital Stay: Payer: Medicare Other | Attending: Hematology | Admitting: Hematology

## 2022-05-23 ENCOUNTER — Encounter: Payer: Self-pay | Admitting: Hematology

## 2022-05-23 VITALS — BP 154/77 | HR 70 | Temp 99.3°F | Resp 18 | Ht 62.0 in | Wt 129.4 lb

## 2022-05-23 DIAGNOSIS — C50911 Malignant neoplasm of unspecified site of right female breast: Secondary | ICD-10-CM | POA: Diagnosis not present

## 2022-05-23 DIAGNOSIS — Z170 Estrogen receptor positive status [ER+]: Secondary | ICD-10-CM | POA: Insufficient documentation

## 2022-05-23 DIAGNOSIS — E559 Vitamin D deficiency, unspecified: Secondary | ICD-10-CM | POA: Diagnosis not present

## 2022-05-23 DIAGNOSIS — C773 Secondary and unspecified malignant neoplasm of axilla and upper limb lymph nodes: Secondary | ICD-10-CM | POA: Diagnosis not present

## 2022-05-23 DIAGNOSIS — C50211 Malignant neoplasm of upper-inner quadrant of right female breast: Secondary | ICD-10-CM | POA: Diagnosis not present

## 2022-05-23 DIAGNOSIS — Z853 Personal history of malignant neoplasm of breast: Secondary | ICD-10-CM

## 2022-05-23 DIAGNOSIS — M8580 Other specified disorders of bone density and structure, unspecified site: Secondary | ICD-10-CM | POA: Diagnosis not present

## 2022-05-23 NOTE — Patient Instructions (Signed)
[place]  Discharge Instructions  You were seen and examined today by Dr. Delton Coombes.  Dr. Delton Coombes discussed your most recent lab work which revealed that everything looks really good.  Continue Calcium, Vitamin D and Anastrozole as prescribed.  Follow-up as scheduled in 6 months with labs and mammogram prior.    Thank you for choosing [facility] to provide your oncology and hematology care.   To afford each patient quality time with our provider, please arrive at least 15 minutes before your scheduled appointment time. You may need to reschedule your appointment if you arrive late (10 or more minutes). Arriving late affects you and other patients whose appointments are after yours.  Also, if you miss three or more appointments without notifying the office, you may be dismissed from the clinic at the provider's discretion.    Again, thank you for choosing [facility].  Our hope is that these requests will decrease the amount of time that you wait before being seen by our physicians.   If you have a lab appointment with the [facility] please come in thru the Main Entrance and check in at the main information desk.           _____________________________________________________________  Should you have questions after your visit to [facility], please contact our office at [phone] and follow the prompts.  Our office hours are 8:00 a.m. to 4:30 p.m. Monday - Thursday and 8:00 a.m. to 2:30 p.m. Friday.  Please note that voicemails left after 4:00 p.m. may not be returned until the following business day.  We are closed weekends and all major holidays.  You do have access to a nurse 24-7, just call the main number to the clinic [phone] and do not press any options, hold on the line and a nurse will answer the phone.    For prescription refill requests, have your pharmacy contact our office and  allow 72 hours.    Masks are optional in the cancer centers. If you would like for your care team to wear a mask while they are taking care of you, please let them know. You may have one support person who is at least 73 years old accompany you for your appointments.

## 2022-06-02 NOTE — Progress Notes (Signed)
[place] [address] [place] [address]   Patient Care Team: Asencion Noble, MD as PCP - General Rourk, Cristopher Estimable, MD as Consulting Physician (Gastroenterology) Derek Jack, MD as Medical Oncologist (Medical Oncology) Brien Mates, RN as Oncology Nurse Navigator (Medical Oncology)  SUMMARY OF ONCOLOGIC HISTORY: Oncology History   No history exists.    CHIEF COMPLIANT: Follow-up of right breast cancer   INTERVAL HISTORY: [name] is a 71 y.o. female seen today for follow-up of right breast cancer.  She reports energy levels of 75%.  Denies any new onset pains. Today she reports feeling good.  She is tolerating anastrozole very well.  Occasional hot flashes present.  No musculoskeletal symptoms.  REVIEW OF SYSTEMS:   Review of Systems  Constitutional:  Negative for appetite change and fatigue.  All other systems reviewed and are negative.   I have reviewed the past medical history, past surgical history, social history and family history with the patient and they are unchanged from previous note.   ALLERGIES:   has No Known Allergies.   MEDICATIONS:  Current Outpatient Medications  Medication Sig Dispense Refill   acetaminophen (TYLENOL) 500 MG tablet Take 500 mg by mouth as needed for mild pain. Only as needed     anastrozole (ARIMIDEX) 1 MG tablet Take 1 tablet (1 mg total) by mouth daily. 90 tablet 6   Calcium Carbonate-Vitamin D (CALCIUM 600 + D PO) Take 1 tablet by mouth daily.     cetirizine (ZYRTEC) 10 MG tablet Take 10 mg by mouth daily as needed for allergies. As needed     cholecalciferol (VITAMIN D3) 25 MCG (1000 UNIT) tablet Take 1,000 Units by mouth daily.     Multiple Vitamins-Minerals (ONE-A-DAY WOMENS 50 PLUS PO) Take 1 tablet by mouth daily.     valsartan (DIOVAN) 80 MG tablet Take 80 mg by mouth at bedtime.     No current facility-administered medications for this visit.     PHYSICAL  EXAMINATION: Performance status (ECOG): 1 - Symptomatic but completely ambulatory  Vitals:   05/23/22 1425  BP: (!) 154/77  Pulse: 70  Resp: 18  Temp: 99.3 F (37.4 C)  SpO2: 100%   Wt Readings from Last 3 Encounters:  05/23/22 129 lb 6.4 oz (58.7 kg)  04/03/22 128 lb 6 oz (58.2 kg)  01/23/22 130 lb 6 oz (59.1 kg)   Physical Exam Vitals reviewed.  Constitutional:      Appearance: Normal appearance.  Cardiovascular:     Rate and Rhythm: Normal rate and regular rhythm.     Pulses: Normal pulses.     Heart sounds: Normal heart sounds.  Pulmonary:     Effort: Pulmonary effort is normal.     Breath sounds: Normal breath sounds.  Neurological:     General: No focal deficit present.     Mental Status: She is alert and oriented to person, place, and time.  Psychiatric:        Mood and Affect: Mood normal.        Behavior: Behavior normal.     Breast Exam Chaperone: Thana Ates     LABORATORY DATA:  I have reviewed the data as listed    Latest Ref Rng & Units 05/16/2022    2:12 PM  CMP  Glucose 70 - 99 mg/dL 135   BUN 8 - 23 mg/dL 9   Creatinine 0.44 - 1.00 mg/dL 0.80   Sodium 135 - 145 mmol/L  139   Potassium 3.5 - 5.1 mmol/L 4.2   Chloride 98 - 111 mmol/L 104   CO2 22 - 32 mmol/L 27   Calcium 8.9 - 10.3 mg/dL 10.0   Total Protein 6.5 - 8.1 g/dL 8.0   Total Bilirubin 0.3 - 1.2 mg/dL 0.4   Alkaline Phos 38 - 126 U/L 83   AST 15 - 41 U/L 21   ALT 0 - 44 U/L 19    No results found for: "CAN153" Lab Results  Component Value Date   WBC 6.9 05/16/2022   HGB 12.7 05/16/2022   HCT 39.4 05/16/2022   MCV 90.6 05/16/2022   PLT 349 05/16/2022   NEUTROABS 3.8 05/16/2022    ASSESSMENT:  Stage Ia (T1BN0G1) right invasive lobular breast cancer, ER/PR positive, HER2 negative: - Screening mammogram on 09/23/2021 with distortion in the right breast. - Diagnostic mammogram on 10/11/2021 with suspicious right breast mass with spiculated margins at 1 o'clock position  measuring 8 x 8 x 5 mm.  Right axilla demonstrates few borderline lymph nodes with nodular areas of cortical thickening up to 4-5 mm. - Right breast 1:00 biopsy and right axillary lymph node biopsy on 10/18/2021 - Pathology: Invasive lobular carcinoma, grade 1, E-cadherin negative.  HER2 0, ER 60%, PR 50%, Ki-67 2%.  Right axilla lymph node with no metastatic carcinoma identified. - Lumpectomy by Dr. Donne Hazel on 12/12/2021. - Pathology: 0.8 cm invasive lobular carcinoma, grade 2, margins negative.  0/4 sentinel lymph nodes involved.  ER 60% strong positive, PR 60% strong positive, HER2 negative, IHC 0, Ki-67 2%. - Anastrozole started on 01/09/2022.   Social/history: - Lives at home with her husband.  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  Non-smoker. - Mother and sister had colon cancers.   PLAN:  Stage Ia (T1BN0G2) right breast ILC, ER/PR positive, HER2 negative: - She is tolerating anastrozole reasonably well.  Occasional hot flashes but denies any major musculoskeletal symptoms. - She was evaluated by Dr. Dineen Kid and XRT was not offered. - Reviewed her labs which shows normal LFTs and CBC. - We will schedule her for bilateral diagnostic mammogram after 09/23/2022. - RTC 6 months for follow-up with repeat labs.  2.  Osteopenia (DEXA scan from 06/21/2021 T score -1.9): - vitamin D level is 47. - Continue calcium and vitamin D supplements. - Plan to repeat DEXA scan in 2 years.  Breast Cancer therapy associated bone loss: I have recommended calcium, Vitamin D and weight bearing exercises.  Orders placed this encounter:  Orders Placed This Encounter  Procedures   MM DIAG BREAST TOMO BILATERAL   CBC with Differential/Platelet   Comprehensive metabolic panel   VITAMIN D 25 Hydroxy (Vit-D Deficiency, Fractures)     The patient has a good understanding of the overall plan. She agrees with it. She will call with any problems that may develop before the next visit here.  Derek Jack,  MD [place] [phone]

## 2022-06-27 DIAGNOSIS — M1990 Unspecified osteoarthritis, unspecified site: Secondary | ICD-10-CM | POA: Diagnosis not present

## 2022-06-27 DIAGNOSIS — I10 Essential (primary) hypertension: Secondary | ICD-10-CM | POA: Diagnosis not present

## 2022-06-27 DIAGNOSIS — R7301 Impaired fasting glucose: Secondary | ICD-10-CM | POA: Diagnosis not present

## 2022-06-27 DIAGNOSIS — E785 Hyperlipidemia, unspecified: Secondary | ICD-10-CM | POA: Diagnosis not present

## 2022-06-27 DIAGNOSIS — Z79899 Other long term (current) drug therapy: Secondary | ICD-10-CM | POA: Diagnosis not present

## 2022-07-03 ENCOUNTER — Ambulatory Visit: Payer: Medicare Other | Attending: General Surgery

## 2022-07-03 VITALS — Wt 132.1 lb

## 2022-07-03 DIAGNOSIS — Z483 Aftercare following surgery for neoplasm: Secondary | ICD-10-CM | POA: Insufficient documentation

## 2022-07-03 NOTE — Therapy (Signed)
OUTPATIENT PHYSICAL THERAPY SOZO SCREENING NOTE   Patient Name: [name] MRN: [id number] DOB:November 20, 1950, 71 y.o., female Today's Date: 07/03/2022  PCP: Asencion Noble, MD REFERRING PROVIDER: Rolm Bookbinder, MD   PT End of Session - 07/03/22 1030     Visit Number 2   # unchanged due to screen only   PT Start Time 1028    PT Stop Time 1032    PT Time Calculation (min) 4 min    Activity Tolerance Patient tolerated treatment well    Behavior During Therapy [facility] for tasks assessed/performed             Past Medical History:  Diagnosis Date   Abnormal Pap smear    Arthritis    Colon polyps    Hx of seasonal allergies    Hypertension    Postmenopausal HRT (hormone replacement therapy) 11/14/2012   Vaginal Pap smear, abnormal    Past Surgical History:  Procedure Laterality Date   BREAST LUMPECTOMY WITH RADIOACTIVE SEED AND SENTINEL LYMPH NODE BIOPSY Right 12/12/2021   Procedure: RIGHT BREAST LUMPECTOMY WITH RADIOACTIVE SEED AND RIGHT AXILLARY SENTINEL LYMPH NODE BIOPSY;  Surgeon: Rolm Bookbinder, MD;  Location: [place];  Service: General;  Laterality: Right;   COLONOSCOPY N/A 04/15/2014   Procedure: COLONOSCOPY;  Surgeon: Daneil Dolin, MD;  [facility];  Service: Endoscopy;  Laterality: N/A;  9:30 AM   COLONOSCOPY N/A 06/06/2017   Procedure: COLONOSCOPY;  Surgeon: Daneil Dolin, MD;  [facility];  Service: Endoscopy;  Laterality: N/A;  8:30 AM   COLONOSCOPY WITH PROPOFOL N/A 01/13/2021   Procedure: COLONOSCOPY WITH PROPOFOL;  Surgeon: Daneil Dolin, MD;  [facility];  Service: Endoscopy;  Laterality: N/A;  am   LT KNEE SURGERY     POLYPECTOMY  06/06/2017   Procedure: POLYPECTOMY;  Surgeon: Daneil Dolin, MD;  [facility];  Service: Endoscopy;;  Ascending colon x2 (HS)   POLYPECTOMY  01/13/2021   Procedure: POLYPECTOMY INTESTINAL;  Surgeon: Daneil Dolin, MD;  [facility];  Service:  Endoscopy;;   SHOULDER SURGERY     thumb surger     WRIST SURGERY     Patient Active Problem List   Diagnosis Date Noted   Genetic testing 02/27/2022   Invasive lobular carcinoma of right breast in female [facility]) 11/22/2021   Hypertension, essential 11/18/2021   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 ER/PR positive, HER2 negative. Rt lumpectomy and SLNB 0/4 LN positive with Dr. Donne Hazel on 12/12/21 followed by XRT? and antiestrogen therapy. 70cc drained from axillary seroma on 12/27/21  PRECAUTIONS: right UE Lymphedema risk, None  SUBJECTIVE: Pt returns for her 3 month L-Dex screen.   PAIN:  Are you having pain? No  SOZO SCREENING: Patient was assessed today using the SOZO machine to determine the lymphedema index score. This was compared to her baseline score. It was determined that she is within the recommended range when compared to her baseline and no further action is needed at this time. She will continue SOZO screenings. These are done every 3 months for 2 years post operatively followed by every 6 months for 2 years, and then annually.  L-DEX FLOWSHEETS - 07/03/22 1000       L-DEX LYMPHEDEMA SCREENING   Measurement Type Unilateral    L-DEX MEASUREMENT EXTREMITY Upper Extremity    POSITION  Standing    DOMINANT SIDE Right    At Risk Side Right    BASELINE SCORE (UNILATERAL) 3.3    L-DEX SCORE (UNILATERAL) 0.5    VALUE CHANGE (UNILAT) -2.8              Otelia Limes, PTA 07/03/2022, 10:31 AM

## 2022-07-04 DIAGNOSIS — Z853 Personal history of malignant neoplasm of breast: Secondary | ICD-10-CM | POA: Diagnosis not present

## 2022-07-04 DIAGNOSIS — Z0000 Encounter for general adult medical examination without abnormal findings: Secondary | ICD-10-CM | POA: Diagnosis not present

## 2022-07-04 DIAGNOSIS — E785 Hyperlipidemia, unspecified: Secondary | ICD-10-CM | POA: Diagnosis not present

## 2022-07-04 DIAGNOSIS — I10 Essential (primary) hypertension: Secondary | ICD-10-CM | POA: Diagnosis not present

## 2022-07-04 DIAGNOSIS — R7309 Other abnormal glucose: Secondary | ICD-10-CM | POA: Diagnosis not present

## 2022-07-18 IMAGING — US US BREAST BX W/ LOC DEV EA ADD LESION IMG BX SPEC US GUIDE*R*
1 series · 16 of 22 positions shown · non-contrast
Comparison: Previous exam(s).
COMPARISON: Previous exam(s).

Addendum:
CLINICAL DATA: 70-year-old female with a suspicious 0.8 cm mass in
the right breast at the 1 o'clock position as well as indeterminate
lymph nodes in the right axilla.

EXAM:
ULTRASOUND GUIDED RIGHT BREAST CORE NEEDLE BIOPSY
ULTRASOUND-GUIDED RIGHT AXILLA CORE NEEDLE BIOPSY

[Series 1: us breast bx w/ loc dev ea add lesion img bx spec  · 0.07mm/px · 16 of 22 slices shown]
[im 1/22]
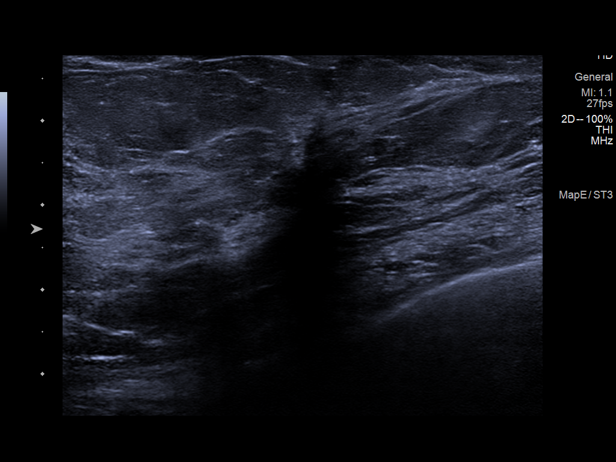
[im 3/22]
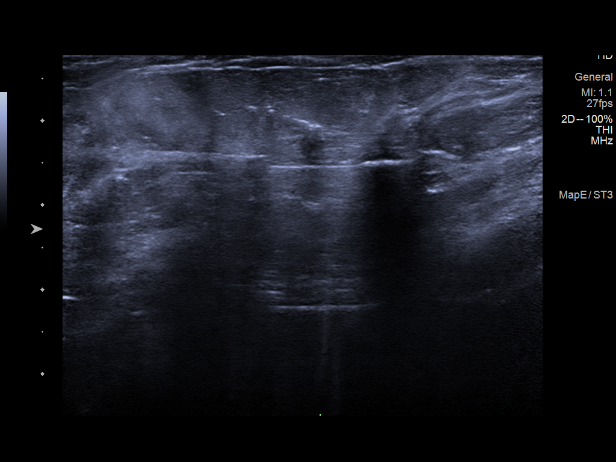
[im 4/22]
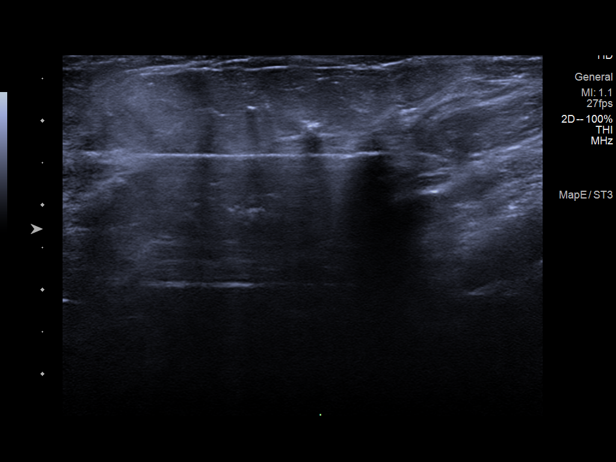
[im 5/22]
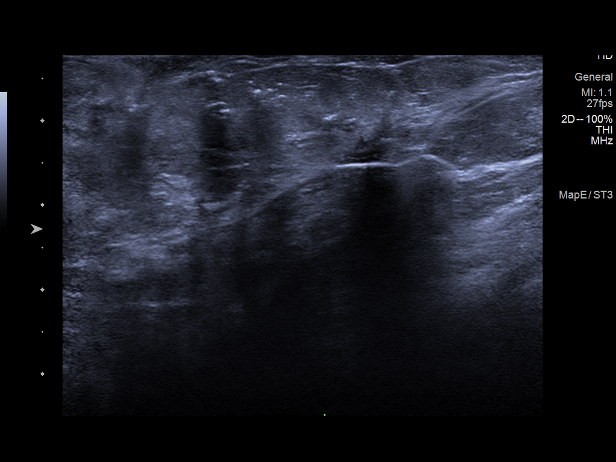
[im 7/22]
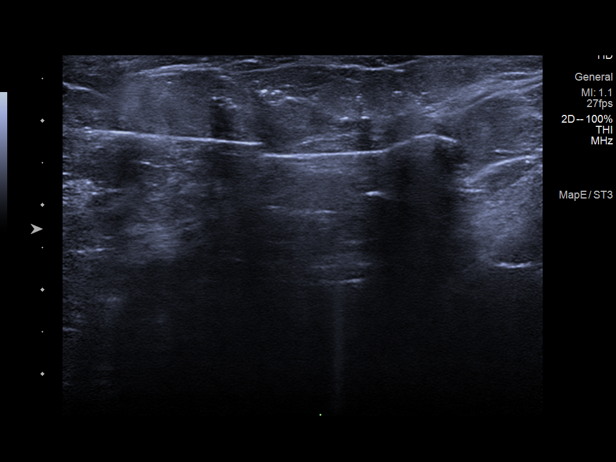
[im 8/22]
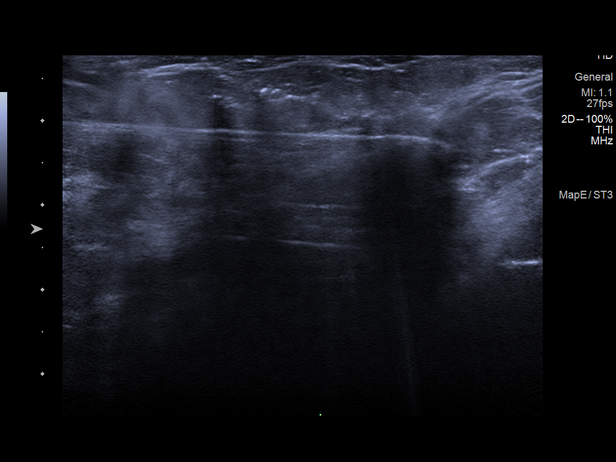
[im 9/22]
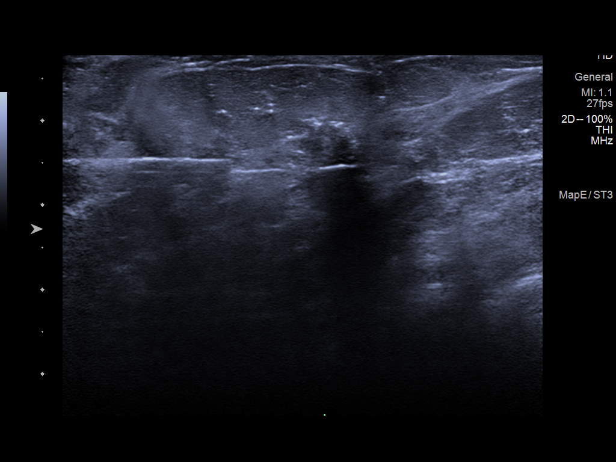
[im 11/22]
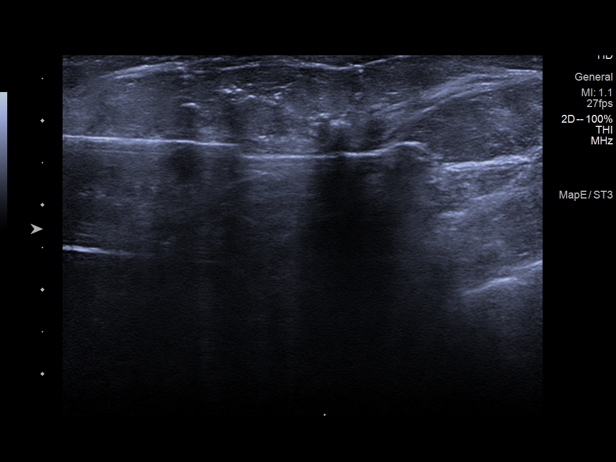
[im 12/22]
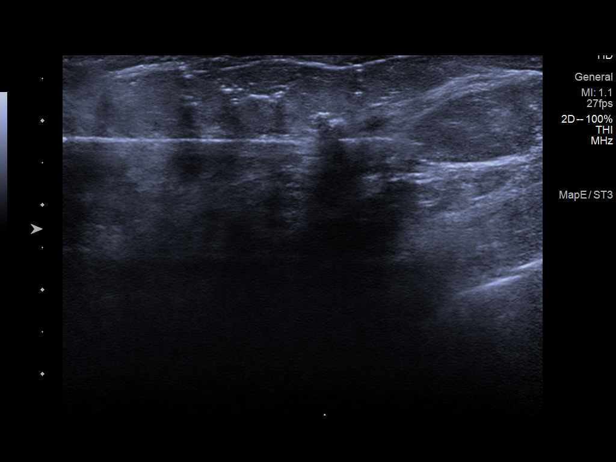
[im 14/22]
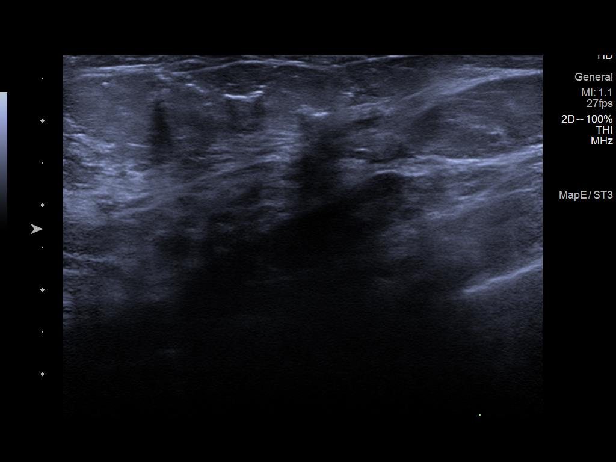
[im 15/22]
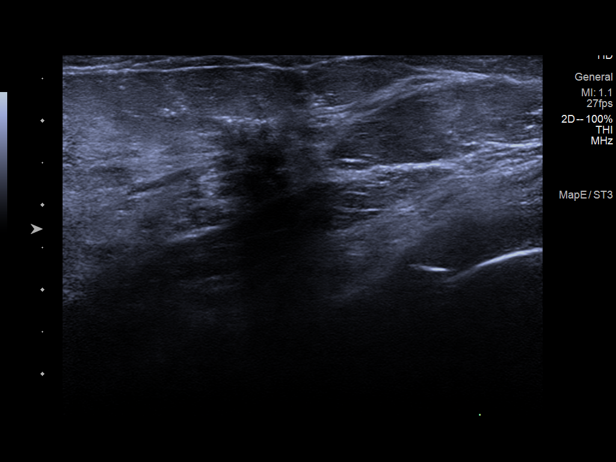
[im 16/22]
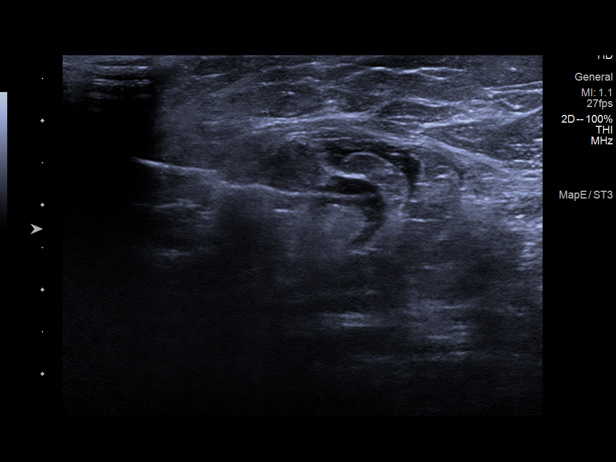
[im 18/22]
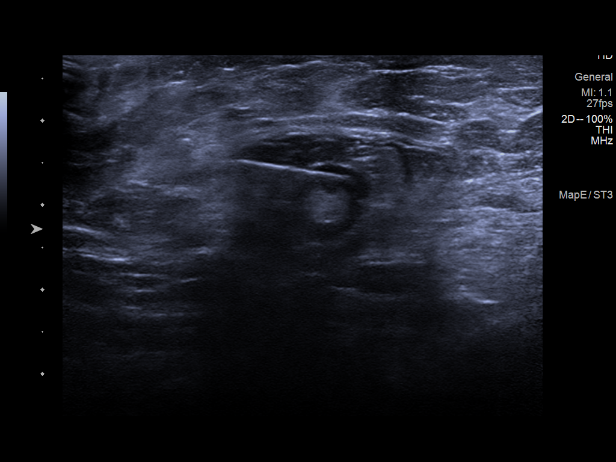
[im 19/22]
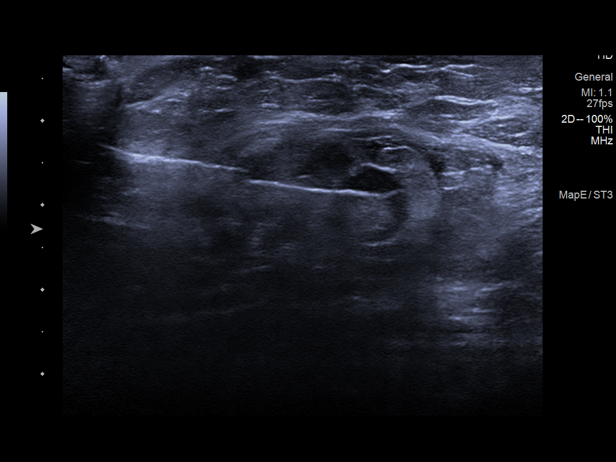
[im 20/22]
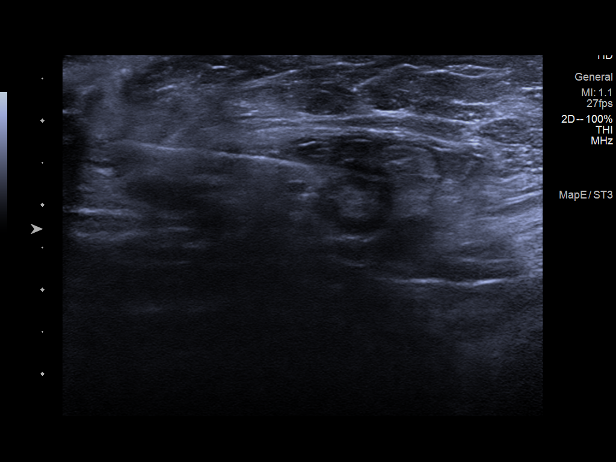
[im 22/22]
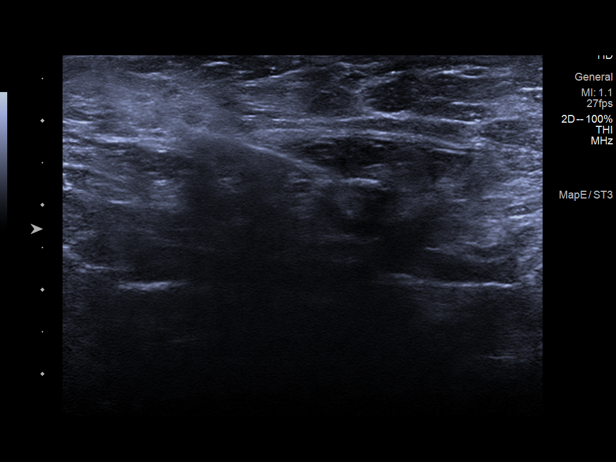

[16 of 22 positions shown; findings below may reference images not displayed]



SITE 1: RIGHT BREAST 1 O'CLOCK: Lesion quadrant: UPPER INNER

Using sterile technique and 1% Lidocaine as local anesthetic, under
direct ultrasound visualization, a 14 gauge Auad device was
used to perform biopsy of the mass in the right breast at the 1
o'clock position using a lateral to medial approach. At the
conclusion of the procedure a ribbon shaped tissue marker clip was
deployed into the biopsy cavity. Follow up 2 view mammogram was
performed and dictated separately.

SITE 2: RIGHT AXILLA: LYMPH NODE: Lesion quadrant: UPPER OUTER

Using sterile technique and 1% Lidocaine as local anesthetic, under
direct ultrasound visualization, a 14 gauge Auad device was
used to perform biopsy of the lymph node in the right axilla using a
inferolateral to superomedial approach. At the conclusion of the
procedure a HydroMARK tissue marker clip was deployed into the
biopsy cavity. Follow up 2 view mammogram was performed and dictated
separately.
IMPRESSION: 1. Ultrasound-guided biopsy of the mass in the right breast at the 1
o'clock position, ribbon shaped marking clip.

2. Ultrasound-guided biopsy of a lymph node with borderline cortical
pinning in the right axilla, at site of HydroMARK biopsy marking
clip.

ADDENDUM:
PATHOLOGY revealed: Site A. BREAST, RIGHT, 1 O'CLOCK, BIOPSY: -
Invasive lobular carcinoma, grade [DATE].

Pathology results are CONCORDANT with imaging findings, per Dr.
Robertto Bareta.

PATHOLOGY revealed: Site B. LYMPH NODE, RIGHT AXILLA, BIOPSY: -
Lymph node with hemosiderin deposition. - No metastatic carcinoma
identified. COMMENT: A. The greatest tumor dimension is 1.3 cm.
E-cadherin immunohistochemistry is negative consistent with invasive
lobular carcinoma. Breast prognostic profile will be performed.

Pathology results are CONCORDANT with imaging findings, per Dr.
Robertto Bareta.

Pathology results and recommendations below were discussed with
patient by telephone on 10/20/2021. Patient reported biopsy site
within normal limits with slight tenderness at the site. Post biopsy
care instructions were reviewed, questions were answered and my
direct phone number was provided to patient. Patient was instructed
to call [HOSPITAL] [HOSPITAL] Mammography Department if
any concerns or questions arise related to the biopsy.

RECOMMENDATIONS: 1. Surgical consultation. Request for surgical
consultation was relayed to Lizicska Ezsias RT at [HOSPITAL] [facility] Department by Nadav Summers RN on 10/20/2021.

2.  Consider bilateral breast MRI given lobular features.

Pathology results reported by Nadav Summers RN on 10/20/2021.



SITE 1: RIGHT BREAST 1 O'CLOCK: Lesion quadrant: UPPER INNER

Using sterile technique and 1% Lidocaine as local anesthetic, under
direct ultrasound visualization, a 14 gauge Auad device was
used to perform biopsy of the mass in the right breast at the 1
o'clock position using a lateral to medial approach. At the
conclusion of the procedure a ribbon shaped tissue marker clip was
deployed into the biopsy cavity. Follow up 2 view mammogram was
performed and dictated separately.

SITE 2: RIGHT AXILLA: LYMPH NODE: Lesion quadrant: UPPER OUTER

Using sterile technique and 1% Lidocaine as local anesthetic, under
direct ultrasound visualization, a 14 gauge Auad device was
used to perform biopsy of the lymph node in the right axilla using a
inferolateral to superomedial approach. At the conclusion of the
procedure a HydroMARK tissue marker clip was deployed into the
biopsy cavity. Follow up 2 view mammogram was performed and dictated
separately.
IMPRESSION: 1. Ultrasound-guided biopsy of the mass in the right breast at the 1
o'clock position, ribbon shaped marking clip.

2. Ultrasound-guided biopsy of a lymph node with borderline cortical
pinning in the right axilla, at site of HydroMARK biopsy marking
clip.

## 2022-07-18 IMAGING — MG MM BREAST LOCALIZATION CLIP
4 series · 4 of 12 positions shown · non-contrast
Comparison: Previous exam(s).

CLINICAL DATA: Post ultrasound-guided biopsy of a 0.8 cm mass in
the right breast at the 1 o'clock position.

EXAM:
3D DIAGNOSTIC RIGHT MAMMOGRAM POST ULTRASOUND BIOPSY

[R MLO synth-2D]
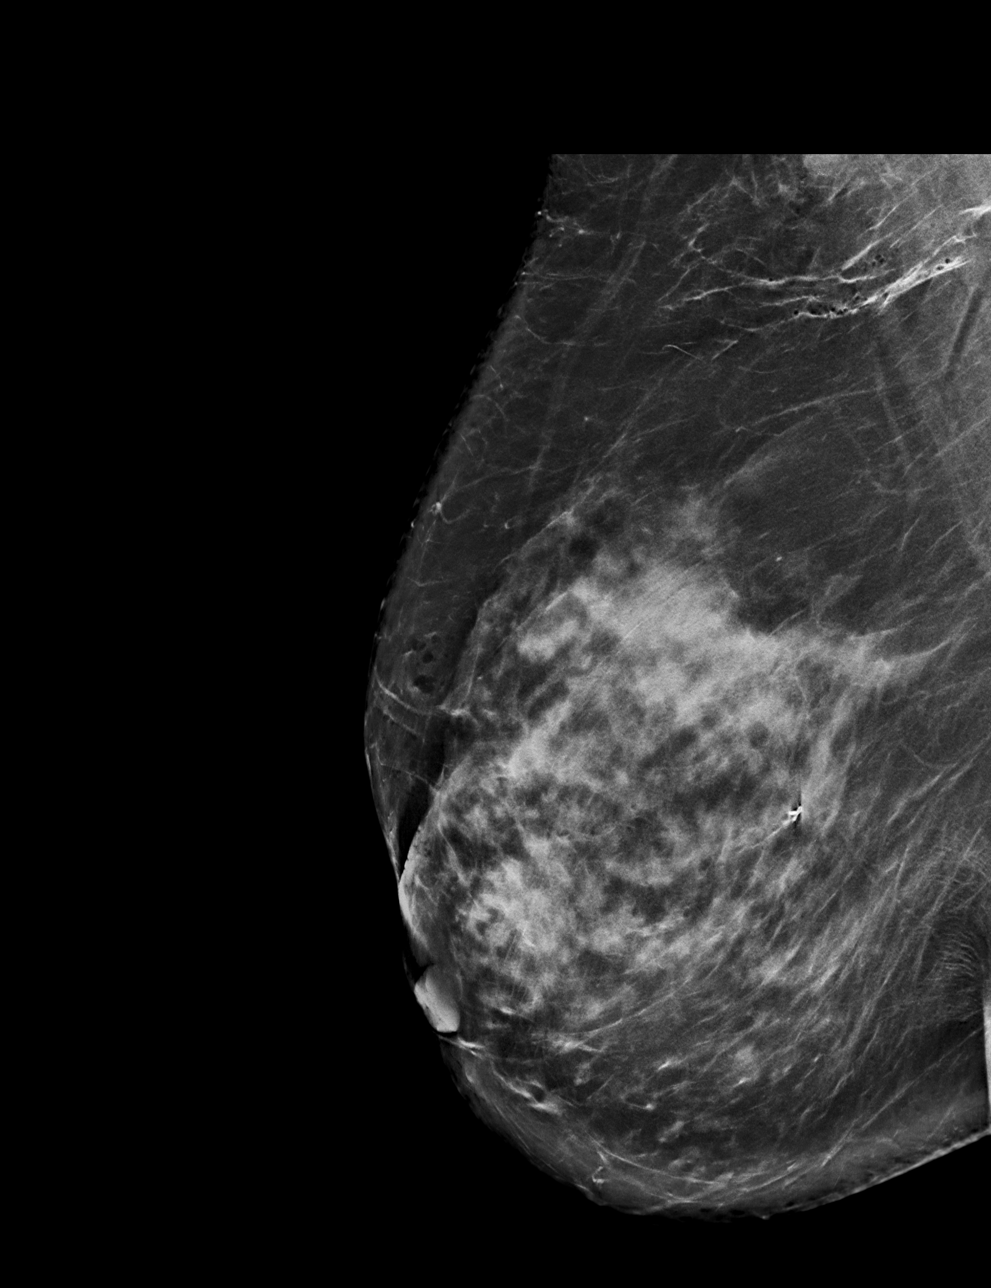

[R CC synth-2D]
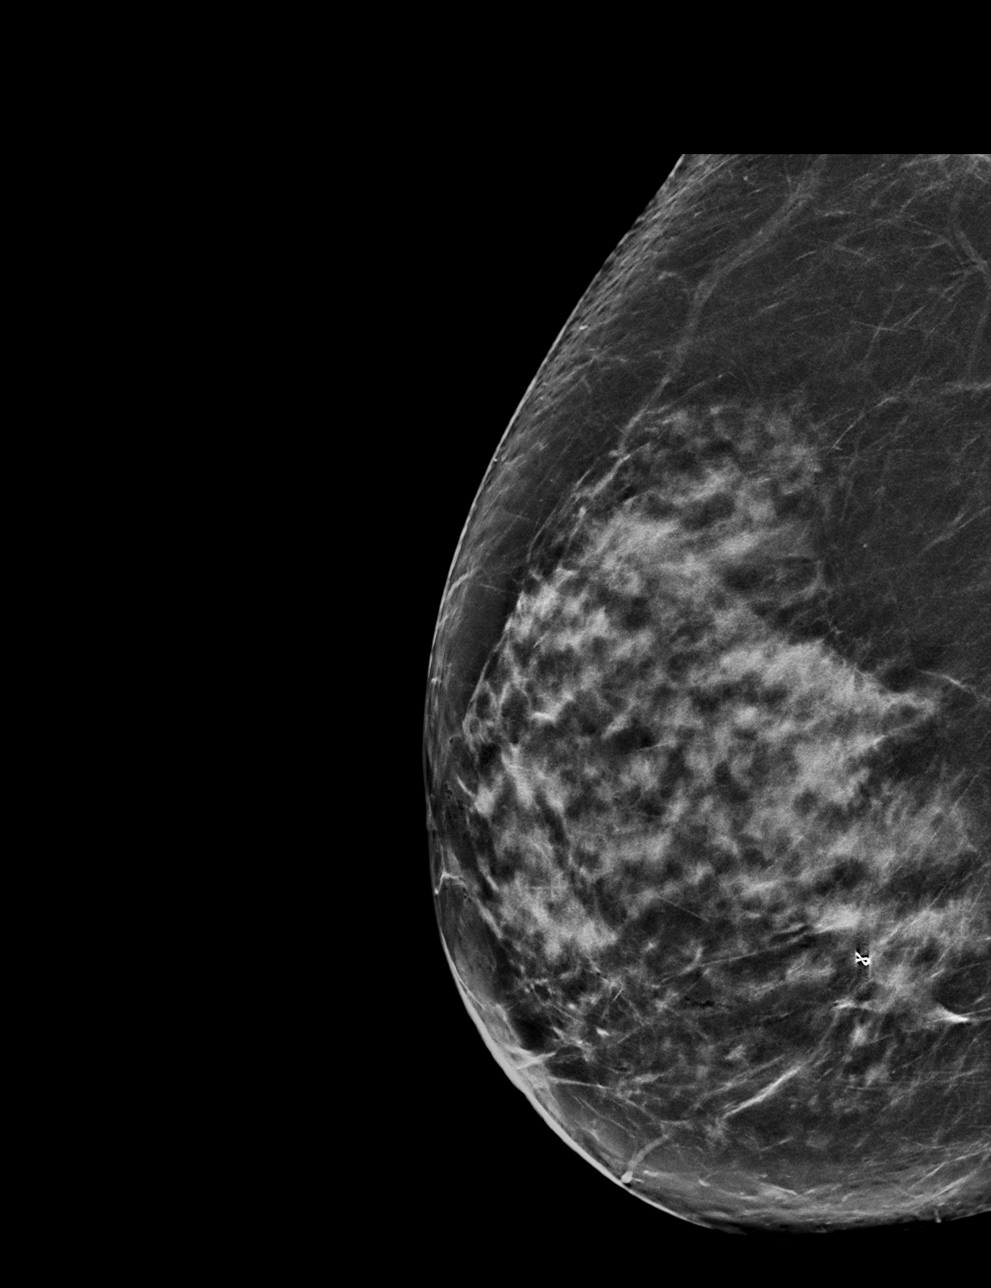

[R MLO tomo · tomo slice 43/86.0]
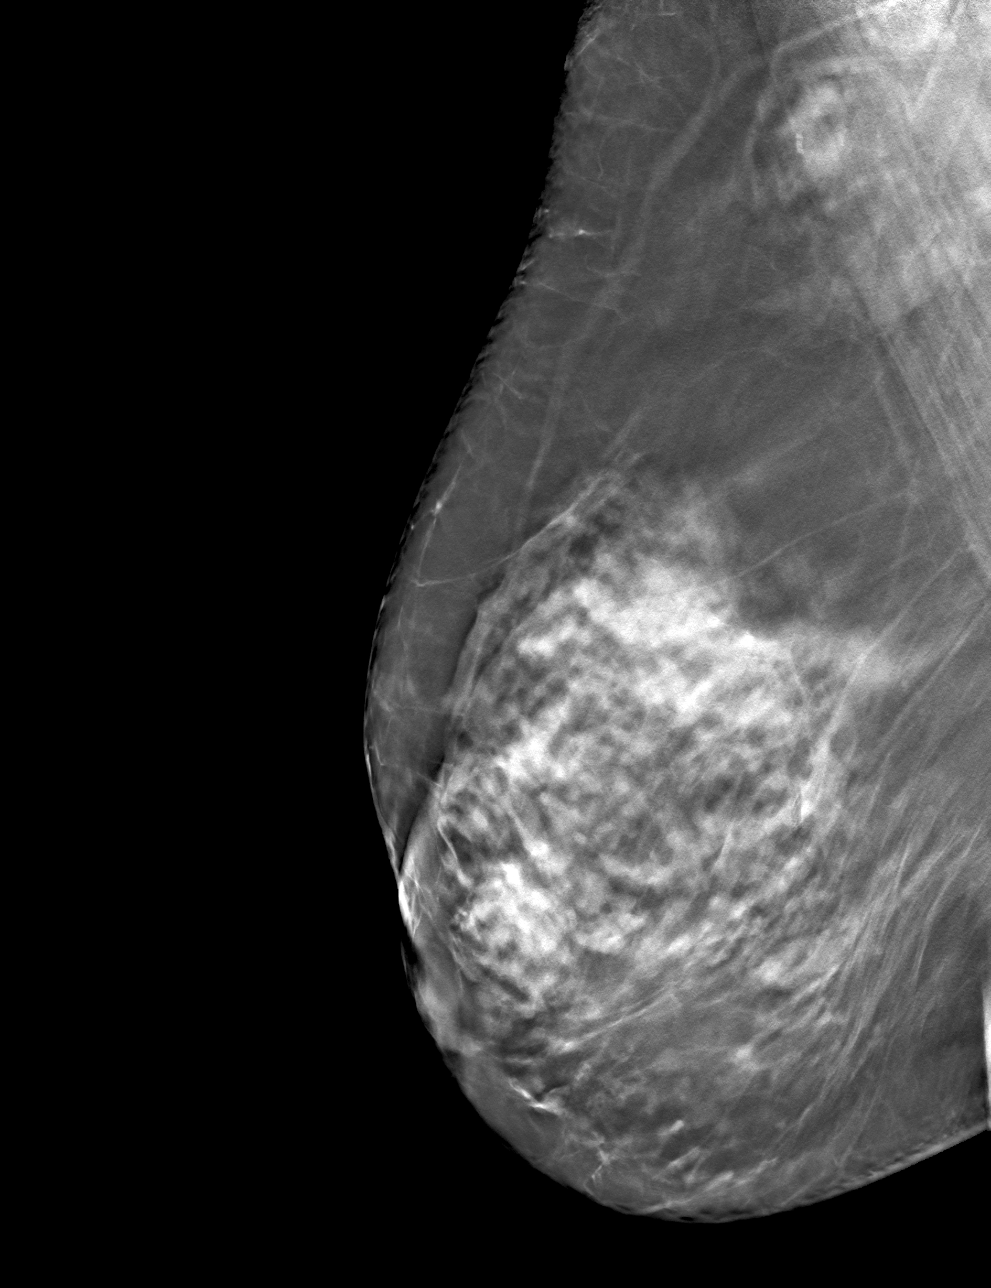

[R CC tomo · tomo slice 36/71.0]
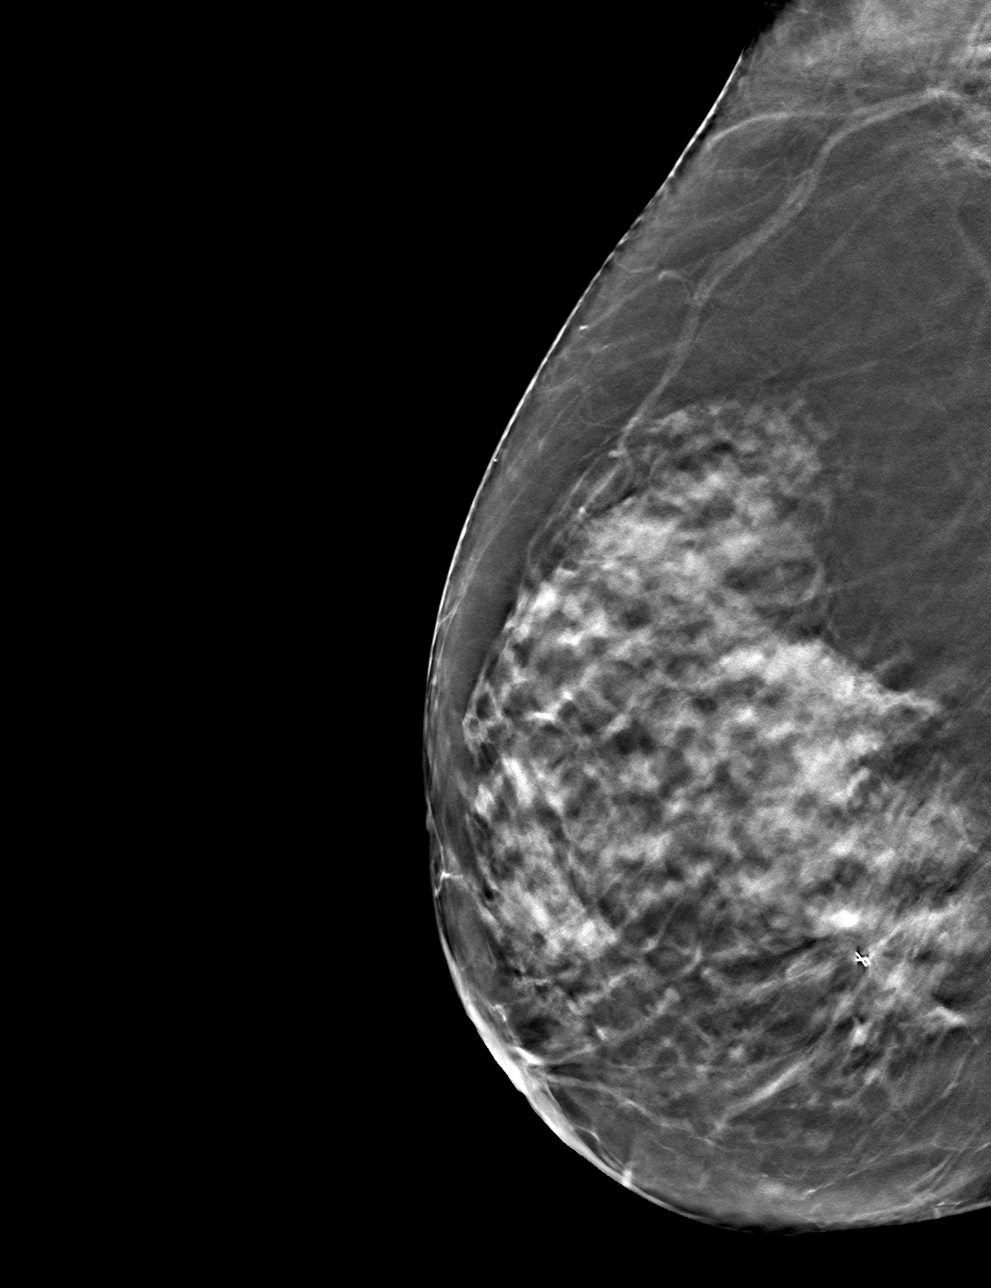

[4 of 12 positions shown; findings below may reference images not displayed]

FINDINGS: 3D Mammographic images were obtained following ultrasound guided
biopsy of a mass in the right breast at the 1 o'clock position. A
ribbon shaped biopsy marking clip is present at the site of the
biopsied mass in the right breast at the 1 o'clock position. A
spiral shaped HydroMARK biopsy marking clip is present at the site
of the biopsied lymph node in the right axilla.
IMPRESSION: 1. Ribbon shaped biopsy marking clip at site of biopsied mass in the
right breast at the 1 o'clock position.

2. Spiral shaped HydroMARK biopsy marking clip at site of biopsied
lymph node in the right axilla.

Final Assessment: Post Procedure Mammograms for Marker Placement

## 2022-07-28 DIAGNOSIS — H6123 Impacted cerumen, bilateral: Secondary | ICD-10-CM | POA: Diagnosis not present

## 2022-09-26 ENCOUNTER — Ambulatory Visit (HOSPITAL_COMMUNITY)
Admission: RE | Admit: 2022-09-26 | Discharge: 2022-09-26 | Disposition: A | Discharge status: Home / Self Care | Payer: Medicare Other | Source: Ambulatory Visit | Attending: Hematology | Admitting: Hematology

## 2022-09-26 ENCOUNTER — Encounter (HOSPITAL_COMMUNITY): Payer: Self-pay

## 2022-09-26 DIAGNOSIS — Z853 Personal history of malignant neoplasm of breast: Secondary | ICD-10-CM | POA: Diagnosis not present

## 2022-09-26 DIAGNOSIS — C50911 Malignant neoplasm of unspecified site of right female breast: Secondary | ICD-10-CM | POA: Diagnosis not present

## 2022-10-16 ENCOUNTER — Ambulatory Visit: Payer: Medicare Other | Attending: General Surgery

## 2022-10-16 VITALS — Wt 134.2 lb

## 2022-10-16 DIAGNOSIS — Z483 Aftercare following surgery for neoplasm: Secondary | ICD-10-CM | POA: Insufficient documentation

## 2022-10-16 NOTE — Therapy (Signed)
OUTPATIENT PHYSICAL THERAPY SOZO SCREENING NOTE   Patient Name: [name] MRN: [id number] DOB:05/13/1951, 72 y.o., female Today's Date: 10/16/2022  PCP: Asencion Noble, MD REFERRING PROVIDER: Rolm Bookbinder, MD   PT End of Session - 10/16/22 1006     Visit Number 2   # unchanged due to screen only   PT Start Time 37    PT Stop Time 1008    PT Time Calculation (min) 4 min    Activity Tolerance Patient tolerated treatment well    Behavior During Therapy [facility] for tasks assessed/performed             Past Medical History:  Diagnosis Date   Abnormal Pap smear    Arthritis    Colon polyps    Hx of seasonal allergies    Hypertension    Postmenopausal HRT (hormone replacement therapy) 11/14/2012   Vaginal Pap smear, abnormal    Past Surgical History:  Procedure Laterality Date   BREAST LUMPECTOMY WITH RADIOACTIVE SEED AND SENTINEL LYMPH NODE BIOPSY Right 12/12/2021   Procedure: RIGHT BREAST LUMPECTOMY WITH RADIOACTIVE SEED AND RIGHT AXILLARY SENTINEL LYMPH NODE BIOPSY;  Surgeon: Rolm Bookbinder, MD;  Location: [place];  Service: General;  Laterality: Right;   COLONOSCOPY N/A 04/15/2014   Procedure: COLONOSCOPY;  Surgeon: Daneil Dolin, MD;  [facility];  Service: Endoscopy;  Laterality: N/A;  9:30 AM   COLONOSCOPY N/A 06/06/2017   Procedure: COLONOSCOPY;  Surgeon: Daneil Dolin, MD;  [facility];  Service: Endoscopy;  Laterality: N/A;  8:30 AM   COLONOSCOPY WITH PROPOFOL N/A 01/13/2021   Procedure: COLONOSCOPY WITH PROPOFOL;  Surgeon: Daneil Dolin, MD;  [facility];  Service: Endoscopy;  Laterality: N/A;  am   LT KNEE SURGERY     POLYPECTOMY  06/06/2017   Procedure: POLYPECTOMY;  Surgeon: Daneil Dolin, MD;  [facility];  Service: Endoscopy;;  Ascending colon x2 (HS)   POLYPECTOMY  01/13/2021   Procedure: POLYPECTOMY INTESTINAL;  Surgeon: Daneil Dolin, MD;  [facility];  Service:  Endoscopy;;   SHOULDER SURGERY     thumb surger     WRIST SURGERY     Patient Active Problem List   Diagnosis Date Noted   Genetic testing 02/27/2022   Invasive lobular carcinoma of right breast in female [facility]) 11/22/2021   Hypertension, essential 11/18/2021   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 ER/PR positive, HER2 negative. Rt lumpectomy and SLNB 0/4 LN positive with Dr. Donne Hazel on 12/12/21 followed by XRT? and antiestrogen therapy. 70cc drained from axillary seroma on 12/27/21  PRECAUTIONS: right UE Lymphedema risk, None  SUBJECTIVE: Pt returns for her 3 month L-Dex screen.   PAIN:  Are you having pain? No  SOZO SCREENING: Patient was assessed today using the SOZO machine to determine the lymphedema index score. This was compared to her baseline score. It was determined that she is within the recommended range when compared to her baseline and no further action is needed at this time. She will continue SOZO screenings. These are done every 3 months for 2 years post operatively followed by every 6 months for 2 years, and then annually.  L-DEX FLOWSHEETS - 10/16/22 1000       L-DEX LYMPHEDEMA SCREENING   Measurement Type Unilateral    L-DEX MEASUREMENT EXTREMITY Upper Extremity    POSITION  Standing    DOMINANT SIDE Right    At Risk Side Right    BASELINE SCORE (UNILATERAL) 3.3    L-DEX SCORE (UNILATERAL) 2.7    VALUE CHANGE (UNILAT) -0.6              Otelia Limes, PTA 10/16/2022, 10:09 AM

## 2022-11-21 ENCOUNTER — Inpatient Hospital Stay: Payer: Medicare Other | Attending: Hematology

## 2022-11-21 DIAGNOSIS — Z170 Estrogen receptor positive status [ER+]: Secondary | ICD-10-CM | POA: Diagnosis not present

## 2022-11-21 DIAGNOSIS — C50911 Malignant neoplasm of unspecified site of right female breast: Secondary | ICD-10-CM

## 2022-11-21 DIAGNOSIS — C50211 Malignant neoplasm of upper-inner quadrant of right female breast: Secondary | ICD-10-CM | POA: Diagnosis not present

## 2022-11-21 DIAGNOSIS — M8580 Other specified disorders of bone density and structure, unspecified site: Secondary | ICD-10-CM | POA: Diagnosis not present

## 2022-11-21 DIAGNOSIS — N644 Mastodynia: Secondary | ICD-10-CM | POA: Insufficient documentation

## 2022-11-21 DIAGNOSIS — Z79811 Long term (current) use of aromatase inhibitors: Secondary | ICD-10-CM | POA: Diagnosis not present

## 2022-11-21 DIAGNOSIS — E559 Vitamin D deficiency, unspecified: Secondary | ICD-10-CM

## 2022-11-21 LAB — COMPREHENSIVE METABOLIC PANEL
ALT: 17 U/L (ref 0–44)
AST: 17 U/L (ref 15–41)
Albumin: 3.8 g/dL (ref 3.5–5.0)
Alkaline Phosphatase: 85 U/L (ref 38–126)
Anion gap: 9 (ref 5–15)
BUN: 7 mg/dL — ABNORMAL LOW (ref 8–23)
CO2: 23 mmol/L (ref 22–32)
Calcium: 8.9 mg/dL (ref 8.9–10.3)
Chloride: 104 mmol/L (ref 98–111)
Creatinine, Ser: 0.76 mg/dL (ref 0.44–1.00)
GFR, Estimated: >60 mL/min (ref >60)
Glucose, Bld: 106 mg/dL — ABNORMAL HIGH (ref 70–99)
Potassium: 3.9 mmol/L (ref 3.5–5.1)
Sodium: 136 mmol/L (ref 135–145)
Total Bilirubin: 0.4 mg/dL (ref 0.3–1.2)
Total Protein: 7.2 g/dL (ref 6.5–8.1)

## 2022-11-21 LAB — CBC WITH DIFFERENTIAL/PLATELET
Abs Immature Granulocytes: 0.02 10*3/uL (ref 0.00–0.07)
Basophils Absolute: 0 10*3/uL (ref 0.0–0.1)
Basophils Relative: 1 %
Eosinophils Absolute: 0.1 10*3/uL (ref 0.0–0.5)
Eosinophils Relative: 3 %
HCT: 36.4 % (ref 36.0–46.0)
Hemoglobin: 11.8 g/dL — ABNORMAL LOW (ref 12.0–15.0)
Immature Granulocytes: 0 %
Lymphocytes Relative: 30 %
Lymphs Abs: 1.7 10*3/uL (ref 0.7–4.0)
MCH: 29.4 pg (ref 26.0–34.0)
MCHC: 32.4 g/dL (ref 30.0–36.0)
MCV: 90.5 fL (ref 80.0–100.0)
Monocytes Absolute: 0.4 10*3/uL (ref 0.1–1.0)
Monocytes Relative: 8 %
Neutro Abs: 3.1 10*3/uL (ref 1.7–7.7)
Neutrophils Relative %: 58 %
Platelets: 327 10*3/uL (ref 150–400)
RBC: 4.02 MIL/uL (ref 3.87–5.11)
RDW: 12.6 % (ref 11.5–15.5)
WBC: 5.4 10*3/uL (ref 4.0–10.5)
nRBC: 0 % (ref 0.0–0.2)

## 2022-11-21 LAB — VITAMIN D 25 HYDROXY (VIT D DEFICIENCY, FRACTURES): Vit D, 25-Hydroxy: 52.67 ng/mL (ref 30–100)

## 2022-11-28 ENCOUNTER — Ambulatory Visit: Payer: Medicare Other | Admitting: Hematology

## 2022-11-30 ENCOUNTER — Inpatient Hospital Stay (HOSPITAL_BASED_OUTPATIENT_CLINIC_OR_DEPARTMENT_OTHER): Payer: Medicare Other | Admitting: Physician Assistant

## 2022-11-30 VITALS — BP 160/75 | HR 70 | Temp 98.3°F | Resp 17 | Ht 62.0 in | Wt 132.8 lb

## 2022-11-30 DIAGNOSIS — C50919 Malignant neoplasm of unspecified site of unspecified female breast: Secondary | ICD-10-CM | POA: Diagnosis not present

## 2022-11-30 DIAGNOSIS — M8580 Other specified disorders of bone density and structure, unspecified site: Secondary | ICD-10-CM | POA: Diagnosis not present

## 2022-11-30 DIAGNOSIS — E559 Vitamin D deficiency, unspecified: Secondary | ICD-10-CM

## 2022-11-30 DIAGNOSIS — Z170 Estrogen receptor positive status [ER+]: Secondary | ICD-10-CM | POA: Diagnosis not present

## 2022-11-30 DIAGNOSIS — N644 Mastodynia: Secondary | ICD-10-CM | POA: Diagnosis not present

## 2022-11-30 DIAGNOSIS — C50211 Malignant neoplasm of upper-inner quadrant of right female breast: Secondary | ICD-10-CM | POA: Diagnosis not present

## 2022-11-30 DIAGNOSIS — Z79811 Long term (current) use of aromatase inhibitors: Secondary | ICD-10-CM

## 2022-11-30 NOTE — Progress Notes (Signed)
[facility] [address] [place] [address]   Patient Care Team: Carylon Perches, MD as PCP - General Rourk, Gerrit Friends, MD as Consulting Physician (Gastroenterology) Doreatha Massed, MD as Medical Oncologist (Medical Oncology) Therese Sarah, RN as Oncology Nurse Navigator (Medical Oncology)  SUMMARY OF ONCOLOGIC HISTORY: Oncology History   No history exists.    CHIEF COMPLIANT: Follow-up of right breast cancer   INTERVAL HISTORY: [name] is a 72 y.o. female seen today for follow-up of right breast cancer. She was last seen by Dr. Ellin Saba on 05/23/2022. In the interim, she denies any changes to her health. She is unaccompanied for this visit.   She reports that she is feeling well without any new or concerning symptoms. She is tolerating anastrozole therapy well without any prohibitive toxicities. She reports occasional episodes of fatigue but denies any interference with her ADLs. Her appetite is overall stable without any changes to her weight. She denies nausea, vomiting or abdominal pain. She will experience occasional episodes of diarrhea that resolve on its own. She denies easy bruising or signs of bleeding. She reports occasional episodes of pain in the upper right breast that last a few seconds. She denies any palpable lumps/masses associated with the pain. She denies any fevers, chills, night sweats, shortness of breath, chest pain or cough. She has no other complaints. Rest of the 10 point ROS is below.   REVIEW OF SYSTEMS:   Constitutional: Negative for appetite change,chills, fever and unexpected weight change. +occasional fatigue HENT: Negative for mouth sores, nosebleeds, sore throat and trouble swallowing.   Eyes: Negative for eye problems and icterus.  Respiratory: Negative for cough, hemoptysis, shortness of breath and wheezing.   Cardiovascular: Negative for chest pain and leg swelling.  Gastrointestinal: Negative for abdominal  pain, constipation, nausea and vomiting. +diarrhea Genitourinary: Negative for bladder incontinence, difficulty urinating, dysuria, frequency and hematuria.   Musculoskeletal: Negative for back pain, gait problem, neck pain and neck stiffness.  Skin:Negative for rash and ulcers Neurological: Negative for dizziness, extremity weakness, gait problem, headaches, light-headedness and seizures.  Hematological: Negative for adenopathy. Does not bruise/bleed easily.  Psychiatric/Behavioral: Negative for confusion, depression and sleep disturbance. The patient is not nervous/anxious.     ALLERGIES:   has No Known Allergies.   MEDICATIONS:  Current Outpatient Medications  Medication Sig Dispense Refill   acetaminophen (TYLENOL) 500 MG tablet Take 500 mg by mouth as needed for mild pain. Only as needed     anastrozole (ARIMIDEX) 1 MG tablet Take 1 tablet (1 mg total) by mouth daily. 90 tablet 6   Calcium Carbonate-Vitamin D (CALCIUM 600 + D PO) Take 1 tablet by mouth daily.     cetirizine (ZYRTEC) 10 MG tablet Take 10 mg by mouth daily as needed for allergies. As needed     cholecalciferol (VITAMIN D3) 25 MCG (1000 UNIT) tablet Take 1,000 Units by mouth daily.     Multiple Vitamins-Minerals (ONE-A-DAY WOMENS 50 PLUS PO) Take 1 tablet by mouth daily.     valsartan (DIOVAN) 80 MG tablet Take 80 mg by mouth at bedtime.     No current facility-administered medications for this visit.     PHYSICAL EXAMINATION: Performance status (ECOG): 1 - Symptomatic but completely ambulatory  Vitals:   11/30/22 1022  BP: (!) 160/75  Pulse: 70  Resp: 17  Temp: 98.3 F (36.8 C)  SpO2: 99%   Wt Readings from Last 3 Encounters:  11/30/22 132 lb 12.8  oz (60.2 kg)  10/16/22 134 lb 4 oz (60.9 kg)  07/03/22 132 lb 2 oz (59.9 kg)   Constitutional: Oriented to person, place, and time and well-developed, well-nourished, and in no distress.  HENT:  Head: Normocephalic and atraumatic.  Eyes: Conjunctivae are  normal. Right eye exhibits no discharge. Left eye exhibits no discharge. No scleral icterus.  Neck: Normal range of motion. Neck supple.   Cardiovascular: Normal rate, regular rhythm, normal heart sounds  Pulmonary/Chest: Effort normal and breath sounds normal. No respiratory distress. No wheezes. No rales.  Musculoskeletal: Normal range of motion. Exhibits no edema.  Lymphadenopathy: No cervical adenopathy.  Neurological: Alert and oriented to person, place, and time. Exhibits normal muscle tone. Gait normal. Coordination normal.  Skin: Skin is warm and dry. No rash noted. Not diaphoretic. No erythema. No pallor.  Psychiatric: Mood, memory and judgment normal.  Breast: No palpable lumps/bumps with both breast or axillary region. Surgical scan appreciated and healed well.    LABORATORY DATA:  I have reviewed the data as listed    Latest Ref Rng & Units 11/21/2022   10:14 AM 05/16/2022    2:12 PM  CMP  Glucose 70 - 99 mg/dL 403106  [id number]   BUN 8 - 23 mg/dL 7  9   Creatinine 2.590.44 - 1.00 mg/dL 5.630.76  8.750.80   Sodium 643135 - 145 mmol/L 136  139   Potassium 3.5 - 5.1 mmol/L 3.9  4.2   Chloride 98 - 111 mmol/L 104  104   CO2 22 - 32 mmol/L 23  27   Calcium 8.9 - 10.3 mg/dL 8.9  32.910.0   Total Protein 6.5 - 8.1 g/dL 7.2  8.0   Total Bilirubin 0.3 - 1.2 mg/dL 0.4  0.4   Alkaline Phos 38 - 126 U/L 85  83   AST 15 - 41 U/L 17  21   ALT 0 - 44 U/L 17  19    No results found for: "CAN153" Lab Results  Component Value Date   WBC 5.4 11/21/2022   HGB 11.8 (L) 11/21/2022   HCT 36.4 11/21/2022   MCV 90.5 11/21/2022   PLT 327 11/21/2022   NEUTROABS 3.1 11/21/2022    ASSESSMENT:  Stage Ia (T1BN0G1) right invasive lobular breast cancer, ER/PR positive, HER2 negative: - Screening mammogram on 09/23/2021 with distortion in the right breast. - Diagnostic mammogram on 10/11/2021 with suspicious right breast mass with spiculated margins at 1 o'clock position measuring 8 x 8 x 5 mm.  Right axilla demonstrates  few borderline lymph nodes with nodular areas of cortical thickening up to 4-5 mm. - Right breast 1:00 biopsy and right axillary lymph node biopsy on 10/18/2021 - Pathology: Invasive lobular carcinoma, grade 1, E-cadherin negative.  HER2 0, ER 60%, PR 50%, Ki-67 2%.  Right axilla lymph node with no metastatic carcinoma identified. - Lumpectomy by Dr. Dwain SarnaWakefield on 12/12/2021. - Pathology: 0.8 cm invasive lobular carcinoma, grade 2, margins negative.  0/4 sentinel lymph nodes involved.  ER 60% strong positive, PR 60% strong positive, HER2 negative, IHC 0, Ki-67 2%. - Anastrozole started on 01/09/2022.   Social/history: - Lives at home with her husband.  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  Non-smoker. - Mother and sister had colon cancers.   PLAN:  1.Stage Ia (T1BN0G2) right breast ILC, ER/PR positive, HER2 negative: - Tolerating anastrozole well without any prohibitive toxicities. -- Labs from 11/21/2022 showed mild anemia of 11.8, MCV 90.5. Vitamin D levels normal. Creatinine and LFTs  normal.  -- Monitor Hgb levels for now and consider further evaluation if anemia worsens or patient c/o of symptoms such as fatigue, SOB, dizziness,etc.  -- Last mammogram was 09/26/2022 without evidence of malignancy. Repeat mammogram due in February 2025.  - RTC 6 months for follow-up with repeat labs.  2.  Osteopenia (DEXA scan from 06/21/2021 T score -1.9): - vitamin D level is 52.67 - Continue calcium and vitamin D supplements. - Plan to repeat DEXA scan around November 2024.   3. Right breast discomfort: -- Suspect post surgical changes or scar tissue -- Breast exam was unremarkable and most recent mammogram was normal -- Advised patient to monitor symptoms closely and follow up with clinic if pain gets worse.   Orders placed this encounter:  No orders of the defined types were placed in this encounter.   The patient has a good understanding of the overall plan. She agrees with it. She will call with any  problems that may develop before the next visit here.  I have spent a total of 30 minutes minutes of face-to-face and non-face-to-face time, preparing to see the patient, performing a medically appropriate examination, counseling and educating the patient, ordering tests/procedures, documenting clinical information in the electronic health record, and care coordination.   Georga Kaufmann PA-C Dept of Hematology and Oncology [facility]

## 2022-12-25 ENCOUNTER — Other Ambulatory Visit: Payer: Self-pay

## 2022-12-25 ENCOUNTER — Other Ambulatory Visit: Payer: Self-pay | Admitting: *Deleted

## 2022-12-25 MED ORDER — ANASTROZOLE 1 MG PO TABS: 1.0000 mg | ORAL_TABLET | Freq: Every day | ORAL | 6 refills | Status: DC

## 2023-01-02 ENCOUNTER — Ambulatory Visit: Payer: Medicare Other | Attending: General Surgery

## 2023-01-02 VITALS — Wt 130.4 lb

## 2023-01-02 DIAGNOSIS — Z483 Aftercare following surgery for neoplasm: Secondary | ICD-10-CM | POA: Insufficient documentation

## 2023-01-02 NOTE — Therapy (Signed)
OUTPATIENT PHYSICAL THERAPY SOZO SCREENING NOTE   Patient Name: [name] MRN: [id number] DOB:[birth year], 72 y.o., female Today's Date: 01/02/2023  PCP: Carylon Perches, MD REFERRING PROVIDER: Emelia Loron, MD   PT End of Session - 01/02/23 [phone]     Visit Number 2   # unchanged due to screen only   PT Start Time 0949    PT Stop Time 0954    PT Time Calculation (min) 5 min    Activity Tolerance Patient tolerated treatment well    Behavior During Therapy [facility] for tasks assessed/performed             Past Medical History:  Diagnosis Date   Abnormal Pap smear    Arthritis    Colon polyps    Hx of seasonal allergies    Hypertension    Postmenopausal HRT (hormone replacement therapy) 11/14/2012   Vaginal Pap smear, abnormal    Past Surgical History:  Procedure Laterality Date   BREAST LUMPECTOMY WITH RADIOACTIVE SEED AND SENTINEL LYMPH NODE BIOPSY Right 12/12/2021   Procedure: RIGHT BREAST LUMPECTOMY WITH RADIOACTIVE SEED AND RIGHT AXILLARY SENTINEL LYMPH NODE BIOPSY;  Surgeon: Emelia Loron, MD;  Location: [facility];  Service: General;  Laterality: Right;   COLONOSCOPY N/A 04/15/2014   Procedure: COLONOSCOPY;  Surgeon: Corbin Ade, MD;  [facility];  Service: Endoscopy;  Laterality: N/A;  9:30 AM   COLONOSCOPY N/A 06/06/2017   Procedure: COLONOSCOPY;  Surgeon: Corbin Ade, MD;  [facility];  Service: Endoscopy;  Laterality: N/A;  8:30 AM   COLONOSCOPY WITH PROPOFOL N/A 01/13/2021   Procedure: COLONOSCOPY WITH PROPOFOL;  Surgeon: Corbin Ade, MD;  [facility];  Service: Endoscopy;  Laterality: N/A;  am   LT KNEE SURGERY     POLYPECTOMY  06/06/2017   Procedure: POLYPECTOMY;  Surgeon: Corbin Ade, MD;  [facility];  Service: Endoscopy;;  Ascending colon x2 (HS)   POLYPECTOMY  01/13/2021   Procedure: POLYPECTOMY INTESTINAL;  Surgeon: Corbin Ade, MD;  [facility];  Service:  Endoscopy;;   SHOULDER SURGERY     thumb surger     WRIST SURGERY     Patient Active Problem List   Diagnosis Date Noted   Genetic testing 02/27/2022   Invasive lobular carcinoma of right breast in female [facility]) 11/22/2021   Hypertension, essential 11/18/2021   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 ER/PR positive, HER2 negative. Rt lumpectomy and SLNB 0/4 LN positive with Dr. Dwain Sarna on 12/12/21 followed by XRT? and antiestrogen therapy. 70cc drained from axillary seroma on 12/27/21  PRECAUTIONS: right UE Lymphedema risk, None  SUBJECTIVE: Pt returns for her 3 month L-Dex screen.   PAIN:  Are you having pain? No  SOZO SCREENING: Patient was assessed today using the SOZO machine to determine the lymphedema index score. This was compared to her baseline score. It was determined that she is within the recommended range when compared to her baseline and no further action is needed at this time. She will continue SOZO screenings. These are done every 3 months for 2 years post operatively followed by every 6 months for 2 years, and then annually.  L-DEX FLOWSHEETS - 01/02/23 0900       L-DEX LYMPHEDEMA SCREENING   Measurement Type Unilateral    L-DEX MEASUREMENT EXTREMITY Upper Extremity    POSITION  Standing    DOMINANT SIDE Right    At Risk Side Right    BASELINE SCORE (UNILATERAL) 3.3    L-DEX SCORE (UNILATERAL) 4.3    VALUE CHANGE (UNILAT) 1              Hermenia Bers, PTA 01/02/2023, 10:04 AM

## 2023-01-04 DIAGNOSIS — C50911 Malignant neoplasm of unspecified site of right female breast: Secondary | ICD-10-CM | POA: Diagnosis not present

## 2023-01-11 DIAGNOSIS — H43393 Other vitreous opacities, bilateral: Secondary | ICD-10-CM | POA: Diagnosis not present

## 2023-03-26 ENCOUNTER — Ambulatory Visit: Payer: Medicare Other | Attending: General Surgery

## 2023-03-26 VITALS — Wt 131.1 lb

## 2023-03-26 DIAGNOSIS — Z483 Aftercare following surgery for neoplasm: Secondary | ICD-10-CM | POA: Insufficient documentation

## 2023-03-26 NOTE — Therapy (Signed)
OUTPATIENT PHYSICAL THERAPY SOZO SCREENING NOTE   Patient Name: [name] MRN: [id number] DOB:12-Feb-1951, 72 y.o., female Today's Date: 03/26/2023  PCP: Carylon Perches, MD REFERRING PROVIDER: Emelia Loron, MD   PT End of Session - 03/26/23 [phone]     Visit Number 2   # unchanged due to screen only   PT Start Time 0948    PT Stop Time 0952    PT Time Calculation (min) 4 min    Activity Tolerance Patient tolerated treatment well    Behavior During Therapy [facility] for tasks assessed/performed             Past Medical History:  Diagnosis Date   Abnormal Pap smear    Arthritis    Colon polyps    Hx of seasonal allergies    Hypertension    Postmenopausal HRT (hormone replacement therapy) 11/14/2012   Vaginal Pap smear, abnormal    Past Surgical History:  Procedure Laterality Date   BREAST LUMPECTOMY WITH RADIOACTIVE SEED AND SENTINEL LYMPH NODE BIOPSY Right 12/12/2021   Procedure: RIGHT BREAST LUMPECTOMY WITH RADIOACTIVE SEED AND RIGHT AXILLARY SENTINEL LYMPH NODE BIOPSY;  Surgeon: Emelia Loron, MD;  Location: [facility];  Service: General;  Laterality: Right;   COLONOSCOPY N/A 04/15/2014   Procedure: COLONOSCOPY;  Surgeon: Corbin Ade, MD;  [facility];  Service: Endoscopy;  Laterality: N/A;  9:30 AM   COLONOSCOPY N/A 06/06/2017   Procedure: COLONOSCOPY;  Surgeon: Corbin Ade, MD;  [facility];  Service: Endoscopy;  Laterality: N/A;  8:30 AM   COLONOSCOPY WITH PROPOFOL N/A 01/13/2021   Procedure: COLONOSCOPY WITH PROPOFOL;  Surgeon: Corbin Ade, MD;  [facility];  Service: Endoscopy;  Laterality: N/A;  am   LT KNEE SURGERY     POLYPECTOMY  06/06/2017   Procedure: POLYPECTOMY;  Surgeon: Corbin Ade, MD;  [facility];  Service: Endoscopy;;  Ascending colon x2 (HS)   POLYPECTOMY  01/13/2021   Procedure: POLYPECTOMY INTESTINAL;  Surgeon: Corbin Ade, MD;  [facility];  Service:  Endoscopy;;   SHOULDER SURGERY     thumb surger     WRIST SURGERY     Patient Active Problem List   Diagnosis Date Noted   Genetic testing 02/27/2022   Invasive lobular carcinoma of right breast in female [facility]) 11/22/2021   Hypertension, essential 11/18/2021   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 ER/PR positive, HER2 negative. Rt lumpectomy and SLNB 0/4 LN positive with Dr. Dwain Sarna on 12/12/21 followed by XRT? and antiestrogen therapy. 70cc drained from axillary seroma on 12/27/21  PRECAUTIONS: right UE Lymphedema risk, None  SUBJECTIVE: Pt returns for her 3 month L-Dex screen.   PAIN:  Are you having pain? No  SOZO SCREENING: Patient was assessed today using the SOZO machine to determine the lymphedema index score. This was compared to her baseline score. It was determined that she is within the recommended range when compared to her baseline and no further action is needed at this time. She will continue SOZO screenings. These are done every 3 months for 2 years post operatively followed by every 6 months for 2 years, and then annually.  L-DEX FLOWSHEETS - 03/26/23 0900       L-DEX LYMPHEDEMA SCREENING   Measurement Type Unilateral    L-DEX MEASUREMENT EXTREMITY Upper Extremity    POSITION  Standing    DOMINANT SIDE Right    At Risk Side Right    BASELINE SCORE (UNILATERAL) 3.3    L-DEX SCORE (UNILATERAL) 2    VALUE CHANGE (UNILAT) -1.3              Hermenia Bers, PTA 03/26/2023, 9:53 AM

## 2023-05-30 ENCOUNTER — Inpatient Hospital Stay: Payer: Medicare Other | Attending: Hematology

## 2023-05-30 DIAGNOSIS — Z79811 Long term (current) use of aromatase inhibitors: Secondary | ICD-10-CM | POA: Diagnosis not present

## 2023-05-30 DIAGNOSIS — M8580 Other specified disorders of bone density and structure, unspecified site: Secondary | ICD-10-CM | POA: Insufficient documentation

## 2023-05-30 DIAGNOSIS — E559 Vitamin D deficiency, unspecified: Secondary | ICD-10-CM

## 2023-05-30 DIAGNOSIS — C50919 Malignant neoplasm of unspecified site of unspecified female breast: Secondary | ICD-10-CM

## 2023-05-30 DIAGNOSIS — C50911 Malignant neoplasm of unspecified site of right female breast: Secondary | ICD-10-CM | POA: Insufficient documentation

## 2023-05-30 DIAGNOSIS — Z170 Estrogen receptor positive status [ER+]: Secondary | ICD-10-CM | POA: Diagnosis not present

## 2023-05-30 DIAGNOSIS — Z79899 Other long term (current) drug therapy: Secondary | ICD-10-CM | POA: Insufficient documentation

## 2023-05-30 DIAGNOSIS — I10 Essential (primary) hypertension: Secondary | ICD-10-CM | POA: Diagnosis not present

## 2023-05-30 LAB — CBC WITH DIFFERENTIAL/PLATELET
Abs Immature Granulocytes: 0.01 10*3/uL (ref 0.00–0.07)
Basophils Absolute: 0.1 10*3/uL (ref 0.0–0.1)
Basophils Relative: 1 %
Eosinophils Absolute: 0.1 10*3/uL (ref 0.0–0.5)
Eosinophils Relative: 2 %
HCT: 37.9 % (ref 36.0–46.0)
Hemoglobin: 12.3 g/dL (ref 12.0–15.0)
Immature Granulocytes: 0 %
Lymphocytes Relative: 26 %
Lymphs Abs: 1.8 10*3/uL (ref 0.7–4.0)
MCH: 29.4 pg (ref 26.0–34.0)
MCHC: 32.5 g/dL (ref 30.0–36.0)
MCV: 90.5 fL (ref 80.0–100.0)
Monocytes Absolute: 0.5 10*3/uL (ref 0.1–1.0)
Monocytes Relative: 7 %
Neutro Abs: 4.5 10*3/uL (ref 1.7–7.7)
Neutrophils Relative %: 64 %
Platelets: 342 10*3/uL (ref 150–400)
RBC: 4.19 MIL/uL (ref 3.87–5.11)
RDW: 12.6 % (ref 11.5–15.5)
WBC: 7 10*3/uL (ref 4.0–10.5)
nRBC: 0 % (ref 0.0–0.2)

## 2023-05-30 LAB — COMPREHENSIVE METABOLIC PANEL
ALT: 17 U/L (ref 0–44)
AST: 19 U/L (ref 15–41)
Albumin: 4 g/dL (ref 3.5–5.0)
Alkaline Phosphatase: 81 U/L (ref 38–126)
Anion gap: 8 (ref 5–15)
BUN: 9 mg/dL (ref 8–23)
CO2: 26 mmol/L (ref 22–32)
Calcium: 9.3 mg/dL (ref 8.9–10.3)
Chloride: 101 mmol/L (ref 98–111)
Creatinine, Ser: 0.73 mg/dL (ref 0.44–1.00)
GFR, Estimated: >60 mL/min (ref >60)
Glucose, Bld: 158 mg/dL — ABNORMAL HIGH (ref 70–99)
Potassium: 4.2 mmol/L (ref 3.5–5.1)
Sodium: 135 mmol/L (ref 135–145)
Total Bilirubin: 0.6 mg/dL (ref 0.3–1.2)
Total Protein: 7.2 g/dL (ref 6.5–8.1)

## 2023-05-30 LAB — VITAMIN D 25 HYDROXY (VIT D DEFICIENCY, FRACTURES): Vit D, 25-Hydroxy: 57.8 ng/mL (ref 30–100)

## 2023-06-01 ENCOUNTER — Other Ambulatory Visit: Payer: Medicare Other

## 2023-06-05 DIAGNOSIS — Z23 Encounter for immunization: Secondary | ICD-10-CM | POA: Diagnosis not present

## 2023-06-07 ENCOUNTER — Other Ambulatory Visit (HOSPITAL_COMMUNITY): Payer: Self-pay | Admitting: Hematology

## 2023-06-07 ENCOUNTER — Inpatient Hospital Stay: Payer: Medicare Other | Admitting: Hematology

## 2023-06-07 ENCOUNTER — Encounter: Payer: Self-pay | Admitting: Hematology

## 2023-06-07 VITALS — BP 140/69 | Temp 98.2°F | Resp 18 | Wt 130.0 lb

## 2023-06-07 DIAGNOSIS — C50911 Malignant neoplasm of unspecified site of right female breast: Secondary | ICD-10-CM | POA: Diagnosis not present

## 2023-06-07 DIAGNOSIS — Z79811 Long term (current) use of aromatase inhibitors: Secondary | ICD-10-CM | POA: Diagnosis not present

## 2023-06-07 DIAGNOSIS — C50919 Malignant neoplasm of unspecified site of unspecified female breast: Secondary | ICD-10-CM

## 2023-06-07 DIAGNOSIS — Z79899 Other long term (current) drug therapy: Secondary | ICD-10-CM | POA: Diagnosis not present

## 2023-06-07 DIAGNOSIS — Z170 Estrogen receptor positive status [ER+]: Secondary | ICD-10-CM

## 2023-06-07 DIAGNOSIS — C50411 Malignant neoplasm of upper-outer quadrant of right female breast: Secondary | ICD-10-CM

## 2023-06-07 DIAGNOSIS — Z09 Encounter for follow-up examination after completed treatment for conditions other than malignant neoplasm: Secondary | ICD-10-CM

## 2023-06-07 DIAGNOSIS — M8580 Other specified disorders of bone density and structure, unspecified site: Secondary | ICD-10-CM | POA: Diagnosis not present

## 2023-06-07 DIAGNOSIS — Z9889 Other specified postprocedural states: Secondary | ICD-10-CM

## 2023-06-07 DIAGNOSIS — I10 Essential (primary) hypertension: Secondary | ICD-10-CM | POA: Diagnosis not present

## 2023-06-07 NOTE — Patient Instructions (Addendum)
[facility]  Discharge Instructions  You were seen and examined today by Dr. Ellin Saba.  Dr. Ellin Saba discussed your most recent lab work which is stable.  Continue Anastrozole as prescribed.  Follow-up as scheduled.  Thank you for choosing [facility] to provide your oncology and hematology care.   To afford each patient quality time with our provider, please arrive at least 15 minutes before your scheduled appointment time. You may need to reschedule your appointment if you arrive late (10 or more minutes). Arriving late affects you and other patients whose appointments are after yours.  Also, if you miss three or more appointments without notifying the office, you may be dismissed from the clinic at the provider's discretion.    Again, thank you for choosing [facility].  Our hope is that these requests will decrease the amount of time that you wait before being seen by our physicians.   If you have a lab appointment with the Cancer Center - please note that after April 8th, all labs will be drawn in the cancer center.  You do not have to check in or register with the main entrance as you have in the past but will complete your check-in at the cancer center.            _____________________________________________________________  Should you have questions after your visit to [facility], please contact our office at [phone] and follow the prompts.  Our office hours are 8:00 a.m. to 4:30 p.m. Monday - Thursday and 8:00 a.m. to 2:30 p.m. Friday.  Please note that voicemails left after 4:00 p.m. may not be returned until the following business day.  We are closed weekends and all major holidays.  You do have access to a nurse 24-7, just call the main number to the clinic [phone] and do not press any options, hold on the line and a nurse will answer the phone.    For prescription refill requests,  have your pharmacy contact our office and allow 72 hours.    Masks are no longer required in the cancer centers. If you would like for your care team to wear a mask while they are taking care of you, please let them know. You may have one support person who is at least 74 years old accompany you for your appointments.

## 2023-06-07 NOTE — Progress Notes (Signed)
Patient is taking Anastrozole as prescribed with no reported side effects.

## 2023-06-07 NOTE — Progress Notes (Signed)
[facility] [address] [place] [address]    Clinic Day:  06/07/2023  Referring physician: Carylon Perches, MD  Patient Care Team: Carylon Perches, MD as PCP - General Rourk, Gerrit Friends, MD as Consulting Physician (Gastroenterology) Doreatha Massed, MD as Medical Oncologist (Medical Oncology) Therese Sarah, RN as Oncology Nurse Navigator (Medical Oncology)   ASSESSMENT & PLAN:   Assessment: Stage Ia (T1BN0G1) right invasive lobular breast cancer, ER/PR positive, HER2 negative: - Screening mammogram on 09/23/2021 with distortion in the right breast. - Diagnostic mammogram on 10/11/2021 with suspicious right breast mass with spiculated margins at 1 o'clock position measuring 8 x 8 x 5 mm.  Right axilla demonstrates few borderline lymph nodes with nodular areas of cortical thickening up to 4-5 mm. - Right breast 1:00 biopsy and right axillary lymph node biopsy on 10/18/2021 - Pathology: Invasive lobular carcinoma, grade 1, E-cadherin negative.  HER2 0, ER 60%, PR 50%, Ki-67 2%.  Right axilla lymph node with no metastatic carcinoma identified. - Lumpectomy by Dr. Dwain Sarna on 12/12/2021. - Pathology: 0.8 cm invasive lobular carcinoma, grade 2, margins negative.  0/4 sentinel lymph nodes involved.  ER 60% strong positive, PR 60% strong positive, HER2 negative, IHC 0, Ki-67 2%. - Anastrozole started on 01/09/2022.  XRT was not offered by radiation oncology.   Social/history: - Lives at home with her husband.  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  Non-smoker. - Mother and sister had colon cancers.  Plan: Stage Ia (T1BN0G2) right breast ILC, ER/PR positive, HER2 negative: - She is tolerating anastrozole except for occasional hot flashes. - Reviewed labs from 05/30/2023: Normal LFTs.  CBC grossly normal. - Last mammogram on 09/26/2022: BI-RADS Category 2. - RTC 6 months for follow-up.  She will have another mammogram in February.  2.  Osteopenia (DEXA scan from 06/21/2021 T  score -1.9): - Continue calcium and vitamin D supplements. - Vitamin D is 57. - Repeat DEXA scan prior to next visit.   Breast Cancer therapy associated bone loss: I have recommended calcium, Vitamin D and weight bearing exercises.  Orders Placed This Encounter  Procedures   DG Bone Density    Standing Status:   Future    Standing Expiration Date:   06/06/2024    Order Specific Question:   Reason for Exam (SYMPTOM  OR DIAGNOSIS REQUIRED)    Answer:   ai therapy evaluation    Order Specific Question:   Preferred imaging location?    Answer:   [facility]    Order Specific Question:   Release to patient    Answer:   Immediate   MM 3D DIAGNOSTIC MAMMOGRAM BILATERAL BREAST    Standing Status:   Future    Standing Expiration Date:   06/06/2024    Order Specific Question:   Reason for Exam (SYMPTOM  OR DIAGNOSIS REQUIRED)    Answer:   screening mammogram after breast cancer    Order Specific Question:   Preferred imaging location?    Answer:   [facility]    Order Specific Question:   Release to patient    Answer:   Immediate   CBC with Differential    Standing Status:   Future    Standing Expiration Date:   06/06/2024   Comprehensive metabolic panel    Standing Status:   Future    Standing Expiration Date:   06/06/2024      Alben Deeds Teague,acting as a scribe for [employer],  MD.,have documented all relevant documentation on the behalf of Doreatha Massed, MD,as directed by  Doreatha Massed, MD while in the presence of Doreatha Massed, MD.   I, Doreatha Massed MD, have reviewed the above documentation for accuracy and completeness, and I agree with the above.   Doreatha Massed, MD   10/17/20246:55 PM  CHIEF COMPLAINT:   Diagnosis: Invasive lobular carcinoma of right breast in female    Cancer Staging  Invasive lobular carcinoma of right breast in female [facility]) Staging form: Breast, AJCC 8th Edition - Clinical stage from  11/22/2021: Stage IA (cT1b, cN0, cM0, G1, ER+, PR+, HER2-) - Unsigned    Prior Therapy: Lumpectomy  Current Therapy:  Anastrozole   HISTORY OF PRESENT ILLNESS:   Oncology History   No history exists.     INTERVAL HISTORY:   [name] is a 72 y.o. female presenting to clinic today for follow up of right breast cancer. She was last seen by me on 05/23/22. He was last seen by PA Thayil on 11/30/22.   Since her last visit, she underwent a bilateral MM on 09/26/22 that found: new lumpectomy changes in the right breast and no mammographic evidence of malignancy in either breast.  Today, she states that she is doing well overall. Her appetite level is at 75%. Her energy level is at 100%. She denies any medical issues since her last visit. She is tolerating Anastrozole and denies any aches or pains. She does have mild occasional hot flashes.   PAST MEDICAL HISTORY:   Past Medical History: Past Medical History:  Diagnosis Date   Abnormal Pap smear    Arthritis    Colon polyps    Hx of seasonal allergies    Hypertension    Postmenopausal HRT (hormone replacement therapy) 11/14/2012   Vaginal Pap smear, abnormal     Surgical History: Past Surgical History:  Procedure Laterality Date   BREAST LUMPECTOMY WITH RADIOACTIVE SEED AND SENTINEL LYMPH NODE BIOPSY Right 12/12/2021   Procedure: RIGHT BREAST LUMPECTOMY WITH RADIOACTIVE SEED AND RIGHT AXILLARY SENTINEL LYMPH NODE BIOPSY;  Surgeon: Emelia Loron, MD;  [facility];  Service: General;  Laterality: Right;   COLONOSCOPY N/A 04/15/2014   Procedure: COLONOSCOPY;  Surgeon: Corbin Ade, MD;  [facility];  Service: Endoscopy;  Laterality: N/A;  9:30 AM   COLONOSCOPY N/A 06/06/2017   Procedure: COLONOSCOPY;  Surgeon: Corbin Ade, MD;  [facility];  Service: Endoscopy;  Laterality: N/A;  8:30 AM   COLONOSCOPY WITH PROPOFOL N/A 01/13/2021   Procedure: COLONOSCOPY WITH PROPOFOL;  Surgeon:  Corbin Ade, MD;  [facility];  Service: Endoscopy;  Laterality: N/A;  am   LT KNEE SURGERY     POLYPECTOMY  06/06/2017   Procedure: POLYPECTOMY;  Surgeon: Corbin Ade, MD;  [facility];  Service: Endoscopy;;  Ascending colon x2 (HS)   POLYPECTOMY  01/13/2021   Procedure: POLYPECTOMY INTESTINAL;  Surgeon: Corbin Ade, MD;  [facility];  Service: Endoscopy;;   SHOULDER SURGERY     thumb surger     WRIST SURGERY      Social History: Social History   Socioeconomic History   Marital status: Married    Spouse name: Not on file   Number of children: Not on file   Years of education: Not on file   Highest education level: Not on file  Occupational History   Not on file  Tobacco Use  Smoking status: Never   Smokeless tobacco: Never  Vaping Use   Vaping status: Never Used  Substance and Sexual Activity   Alcohol use: No    Comment: occ   Drug use: No   Sexual activity: Yes    Birth control/protection: None, Post-menopausal  Other Topics Concern   Not on file  Social History Narrative   Not on file   Social Determinants of Health   Financial Resource Strain: Not on file  Food Insecurity: Not on file  Transportation Needs: Not on file  Physical Activity: Not on file  Stress: Not on file  Social Connections: Not on file  Intimate Partner Violence: Not on file    Family History: Family History  Problem Relation Age of Onset   Diabetes Mother    Colon cancer Mother 51   Colon cancer Sister        dx 30s d. over 7   Depression Sister    Arthritis Sister    Diabetes Maternal Aunt     Current Medications:  Current Outpatient Medications:    acetaminophen (TYLENOL) 500 MG tablet, Take 500 mg by mouth as needed for mild pain. Only as needed, Disp: , Rfl:    anastrozole (ARIMIDEX) 1 MG tablet, Take 1 tablet (1 mg total) by mouth daily., Disp: 90 tablet, Rfl: 6   Calcium Carbonate-Vitamin D (CALCIUM 600 + D PO), Take 1  tablet by mouth daily., Disp: , Rfl:    cetirizine (ZYRTEC) 10 MG tablet, Take 10 mg by mouth daily as needed for allergies. As needed, Disp: , Rfl:    cholecalciferol (VITAMIN D3) 25 MCG (1000 UNIT) tablet, Take 1,000 Units by mouth daily., Disp: , Rfl:    Multiple Vitamins-Minerals (ONE-A-DAY WOMENS 50 PLUS PO), Take 1 tablet by mouth daily., Disp: , Rfl:    valsartan (DIOVAN) 80 MG tablet, Take 80 mg by mouth at bedtime., Disp: , Rfl:    Allergies: No Known Allergies  REVIEW OF SYSTEMS:   Review of Systems  Constitutional:  Negative for chills, fatigue and fever.  HENT:   Negative for lump/mass, mouth sores, nosebleeds, sore throat and trouble swallowing.   Eyes:  Negative for eye problems.  Respiratory:  Negative for cough and shortness of breath.   Cardiovascular:  Negative for chest pain, leg swelling and palpitations.  Gastrointestinal:  Negative for abdominal pain, constipation, diarrhea, nausea and vomiting.  Genitourinary:  Negative for bladder incontinence, difficulty urinating, dysuria, frequency, hematuria and nocturia.   Musculoskeletal:  Negative for arthralgias, back pain, flank pain, myalgias and neck pain.  Skin:  Negative for itching and rash.  Neurological:  Negative for dizziness, headaches and numbness.  Hematological:  Does not bruise/bleed easily.  Psychiatric/Behavioral:  Negative for depression, sleep disturbance and suicidal ideas. The patient is not nervous/anxious.   All other systems reviewed and are negative.    VITALS:   Blood pressure (!) 140/69, temperature 98.2 F (36.8 C), temperature source Oral, resp. rate 18, weight 130 lb (59 kg), SpO2 95%.  Wt Readings from Last 3 Encounters:  06/07/23 130 lb (59 kg)  03/26/23 131 lb 2 oz (59.5 kg)  01/02/23 130 lb 6 oz (59.1 kg)    Body mass index is 23.78 kg/m.  Performance status (ECOG): 1 - Symptomatic but completely ambulatory  PHYSICAL EXAM:   Physical Exam Vitals and nursing note reviewed.  Exam conducted with a chaperone present.  Constitutional:      Appearance: Normal appearance.  Cardiovascular:  Rate and Rhythm: Normal rate and regular rhythm.     Pulses: Normal pulses.     Heart sounds: Normal heart sounds.  Pulmonary:     Effort: Pulmonary effort is normal.     Breath sounds: Normal breath sounds.  Chest:     Comments: +lower outer quadrant below areola of right breast hyperpigmentation from contrast dye +Right lumpectomy scar is well healed +No palpable masses or adenopathy Abdominal:     Palpations: Abdomen is soft. There is no hepatomegaly, splenomegaly or mass.     Tenderness: There is no abdominal tenderness.  Musculoskeletal:     Right lower leg: No edema.     Left lower leg: No edema.  Lymphadenopathy:     Cervical: No cervical adenopathy.     Right cervical: No superficial, deep or posterior cervical adenopathy.    Left cervical: No superficial, deep or posterior cervical adenopathy.     Upper Body:     Right upper body: No supraclavicular or axillary adenopathy.     Left upper body: No supraclavicular or axillary adenopathy.  Neurological:     General: No focal deficit present.     Mental Status: She is alert and oriented to person, place, and time.  Psychiatric:        Mood and Affect: Mood normal.        Behavior: Behavior normal.   Breast Exam Chaperone: Kennith Gain, RN   LABS:      Latest Ref Rng & Units 05/30/2023    9:27 AM 11/21/2022   10:14 AM 05/16/2022    2:12 PM  CBC  WBC 4.0 - 10.5 K/uL 7.0  5.4  6.9   Hemoglobin 12.0 - 15.0 g/dL 91.4  78.2  95.6   Hematocrit 36.0 - 46.0 % 37.9  36.4  39.4   Platelets 150 - 400 K/uL 342  327  349       Latest Ref Rng & Units 05/30/2023    9:27 AM 11/21/2022   10:14 AM 05/16/2022    2:12 PM  CMP  Glucose 70 - 99 mg/dL 213  086  578   BUN 8 - 23 mg/dL 9  7  9    Creatinine 0.44 - 1.00 mg/dL 4.69  6.29  5.28   Sodium 135 - 145 mmol/L 135  136  139   Potassium 3.5 - 5.1 mmol/L 4.2  3.9  4.2    Chloride 98 - 111 mmol/L 101  104  104   CO2 22 - 32 mmol/L 26  23  27    Calcium 8.9 - 10.3 mg/dL 9.3  8.9  41.3   Total Protein 6.5 - 8.1 g/dL 7.2  7.2  8.0   Total Bilirubin 0.3 - 1.2 mg/dL 0.6  0.4  0.4   Alkaline Phos 38 - 126 U/L 81  85  83   AST 15 - 41 U/L 19  17  21    ALT 0 - 44 U/L 17  17  19       No results found for: "CEA1", "CEA" / No results found for: "CEA1", "CEA" No results found for: "PSA1" No results found for: "KGM010" No results found for: "CAN125"  No results found for: "TOTALPROTELP", "ALBUMINELP", "A1GS", "A2GS", "BETS", "BETA2SER", "GAMS", "MSPIKE", "SPEI" No results found for: "TIBC", "FERRITIN", "IRONPCTSAT" No results found for: "LDH"   STUDIES:   No results found.

## 2023-07-02 ENCOUNTER — Ambulatory Visit: Payer: Medicare Other | Attending: General Surgery

## 2023-07-02 VITALS — Wt 128.0 lb

## 2023-07-02 DIAGNOSIS — Z483 Aftercare following surgery for neoplasm: Secondary | ICD-10-CM | POA: Insufficient documentation

## 2023-07-02 NOTE — Therapy (Signed)
OUTPATIENT PHYSICAL THERAPY SOZO SCREENING NOTE   Patient Name: [name] MRN: [id number] DOB:1951/05/05, 72 y.o., female Today's Date: 07/02/2023  PCP: Carylon Perches, MD REFERRING PROVIDER: Emelia Loron, MD   PT End of Session - 07/02/23 1059     Visit Number 2   # unchanged due to screen only   PT Start Time 1058    PT Stop Time 1101    PT Time Calculation (min) 3 min    Activity Tolerance Patient tolerated treatment well    Behavior During Therapy [facility] for tasks assessed/performed             Past Medical History:  Diagnosis Date   Abnormal Pap smear    Arthritis    Colon polyps    Hx of seasonal allergies    Hypertension    Postmenopausal HRT (hormone replacement therapy) 11/14/2012   Vaginal Pap smear, abnormal    Past Surgical History:  Procedure Laterality Date   BREAST LUMPECTOMY WITH RADIOACTIVE SEED AND SENTINEL LYMPH NODE BIOPSY Right 12/12/2021   Procedure: RIGHT BREAST LUMPECTOMY WITH RADIOACTIVE SEED AND RIGHT AXILLARY SENTINEL LYMPH NODE BIOPSY;  Surgeon: Emelia Loron, MD;  Location: [facility];  Service: General;  Laterality: Right;   COLONOSCOPY N/A 04/15/2014   Procedure: COLONOSCOPY;  Surgeon: Corbin Ade, MD;  [facility];  Service: Endoscopy;  Laterality: N/A;  9:30 AM   COLONOSCOPY N/A 06/06/2017   Procedure: COLONOSCOPY;  Surgeon: Corbin Ade, MD;  [facility];  Service: Endoscopy;  Laterality: N/A;  8:30 AM   COLONOSCOPY WITH PROPOFOL N/A 01/13/2021   Procedure: COLONOSCOPY WITH PROPOFOL;  Surgeon: Corbin Ade, MD;  [facility];  Service: Endoscopy;  Laterality: N/A;  am   LT KNEE SURGERY     POLYPECTOMY  06/06/2017   Procedure: POLYPECTOMY;  Surgeon: Corbin Ade, MD;  [facility];  Service: Endoscopy;;  Ascending colon x2 (HS)   POLYPECTOMY  01/13/2021   Procedure: POLYPECTOMY INTESTINAL;  Surgeon: Corbin Ade, MD;  [facility];  Service:  Endoscopy;;   SHOULDER SURGERY     thumb surger     WRIST SURGERY     Patient Active Problem List   Diagnosis Date Noted   Genetic testing 02/27/2022   Invasive lobular carcinoma of right breast in female [facility]) 11/22/2021   Hypertension, essential 11/18/2021   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 ER/PR positive, HER2 negative. Rt lumpectomy and SLNB 0/4 LN positive with Dr. Dwain Sarna on 12/12/21 followed by XRT? and antiestrogen therapy. 70cc drained from axillary seroma on 12/27/21  PRECAUTIONS: right UE Lymphedema risk, None  SUBJECTIVE: Pt returns for her 3 month L-Dex screen.   PAIN:  Are you having pain? No  SOZO SCREENING: Patient was assessed today using the SOZO machine to determine the lymphedema index score. This was compared to her baseline score. It was determined that she is within the recommended range when compared to her baseline and no further action is needed at this time. She will continue SOZO screenings. These are done every 3 months for 2 years post operatively followed by every 6 months for 2 years, and then annually.  L-DEX FLOWSHEETS - 07/02/23 1100       L-DEX LYMPHEDEMA SCREENING   Measurement Type Unilateral    L-DEX MEASUREMENT EXTREMITY Upper Extremity    POSITION  Standing    DOMINANT SIDE Right    At Risk Side Right    BASELINE SCORE (UNILATERAL) 3.3    L-DEX SCORE (UNILATERAL) 4.4    VALUE CHANGE (UNILAT) 1.1              Hermenia Bers, PTA 07/02/2023, 11:01 AM

## 2023-07-03 DIAGNOSIS — Z79899 Other long term (current) drug therapy: Secondary | ICD-10-CM | POA: Diagnosis not present

## 2023-07-03 DIAGNOSIS — C50919 Malignant neoplasm of unspecified site of unspecified female breast: Secondary | ICD-10-CM | POA: Diagnosis not present

## 2023-07-03 DIAGNOSIS — R7301 Impaired fasting glucose: Secondary | ICD-10-CM | POA: Diagnosis not present

## 2023-07-03 DIAGNOSIS — I10 Essential (primary) hypertension: Secondary | ICD-10-CM | POA: Diagnosis not present

## 2023-07-10 ENCOUNTER — Other Ambulatory Visit (HOSPITAL_COMMUNITY): Payer: Medicare Other

## 2023-07-10 ENCOUNTER — Ambulatory Visit (HOSPITAL_COMMUNITY)
Admission: RE | Admit: 2023-07-10 | Discharge: 2023-07-10 | Disposition: A | Discharge status: Home / Self Care | Payer: Medicare Other | Source: Ambulatory Visit | Attending: Physician Assistant | Admitting: Physician Assistant

## 2023-07-10 DIAGNOSIS — Z0000 Encounter for general adult medical examination without abnormal findings: Secondary | ICD-10-CM | POA: Diagnosis not present

## 2023-07-10 DIAGNOSIS — C50919 Malignant neoplasm of unspecified site of unspecified female breast: Secondary | ICD-10-CM | POA: Diagnosis not present

## 2023-07-10 DIAGNOSIS — M8589 Other specified disorders of bone density and structure, multiple sites: Secondary | ICD-10-CM | POA: Diagnosis not present

## 2023-07-10 DIAGNOSIS — R7301 Impaired fasting glucose: Secondary | ICD-10-CM | POA: Diagnosis not present

## 2023-07-10 DIAGNOSIS — E559 Vitamin D deficiency, unspecified: Secondary | ICD-10-CM | POA: Diagnosis not present

## 2023-07-10 DIAGNOSIS — I10 Essential (primary) hypertension: Secondary | ICD-10-CM | POA: Diagnosis not present

## 2023-07-10 DIAGNOSIS — E785 Hyperlipidemia, unspecified: Secondary | ICD-10-CM | POA: Diagnosis not present

## 2023-07-10 DIAGNOSIS — Z79811 Long term (current) use of aromatase inhibitors: Secondary | ICD-10-CM | POA: Insufficient documentation

## 2023-07-10 DIAGNOSIS — Z780 Asymptomatic menopausal state: Secondary | ICD-10-CM | POA: Diagnosis not present

## 2023-07-10 DIAGNOSIS — Z853 Personal history of malignant neoplasm of breast: Secondary | ICD-10-CM | POA: Diagnosis not present

## 2023-07-16 ENCOUNTER — Telehealth: Payer: Self-pay

## 2023-07-16 NOTE — Telephone Encounter (Signed)
Pt advised of results and recommendations.  She is currently taking  total of 1000 mg of calcium and 1020 mcg of Vit D.  She will increase her Vit D up to 4000   and start weight bearing exercise.

## 2023-07-16 NOTE — Telephone Encounter (Signed)
-----   Message from Pollyann Samples sent at 07/15/2023 10:05 PM EST ----- My nurse,  Please let pt know osteopenia is slightly worse since 2022, but frax risk is the same/low. Please optimize calcium and vit D and weight bearing.   Thanks Lacie NP

## 2023-10-01 ENCOUNTER — Ambulatory Visit: Payer: Medicare Other | Attending: General Surgery

## 2023-10-01 VITALS — Wt 129.2 lb

## 2023-10-01 DIAGNOSIS — Z483 Aftercare following surgery for neoplasm: Secondary | ICD-10-CM | POA: Insufficient documentation

## 2023-10-01 NOTE — Therapy (Signed)
 OUTPATIENT PHYSICAL THERAPY SOZO SCREENING NOTE   Patient Name: [name] MRN: [id number] DOB:17-Jun-1951, 73 y.o., female Today's Date: 10/01/2023  PCP: Artemisa Bile, MD REFERRING PROVIDER: Enid Harry, MD   PT End of Session - 10/01/23 1049     Visit Number 2   # unchanged due to screen only   PT Start Time 1048    PT Stop Time 1051    PT Time Calculation (min) 3 min    Activity Tolerance Patient tolerated treatment well    Behavior During Therapy [facility] for tasks assessed/performed             Past Medical History:  Diagnosis Date   Abnormal Pap smear    Arthritis    Colon polyps    Hx of seasonal allergies    Hypertension    Postmenopausal HRT (hormone replacement therapy) 11/14/2012   Vaginal Pap smear, abnormal    Past Surgical History:  Procedure Laterality Date   BREAST LUMPECTOMY WITH RADIOACTIVE SEED AND SENTINEL LYMPH NODE BIOPSY Right 12/12/2021   Procedure: RIGHT BREAST LUMPECTOMY WITH RADIOACTIVE SEED AND RIGHT AXILLARY SENTINEL LYMPH NODE BIOPSY;  Surgeon: Enid Harry, MD;  Location: [facility];  Service: General;  Laterality: Right;   COLONOSCOPY N/A 04/15/2014   Procedure: COLONOSCOPY;  Surgeon: Suzette Espy, MD;  [facility];  Service: Endoscopy;  Laterality: N/A;  9:30 AM   COLONOSCOPY N/A 06/06/2017   Procedure: COLONOSCOPY;  Surgeon: Suzette Espy, MD;  [facility];  Service: Endoscopy;  Laterality: N/A;  8:30 AM   COLONOSCOPY WITH PROPOFOL  N/A 01/13/2021   Procedure: COLONOSCOPY WITH PROPOFOL ;  Surgeon: Suzette Espy, MD;  [facility];  Service: Endoscopy;  Laterality: N/A;  am   LT KNEE SURGERY     POLYPECTOMY  06/06/2017   Procedure: POLYPECTOMY;  Surgeon: Suzette Espy, MD;  [facility];  Service: Endoscopy;;  Ascending colon x2 (HS)   POLYPECTOMY  01/13/2021   Procedure: POLYPECTOMY INTESTINAL;  Surgeon: Suzette Espy, MD;  [facility];  Service:  Endoscopy;;   SHOULDER SURGERY     thumb surger     WRIST SURGERY     Patient Active Problem List   Diagnosis Date Noted   Genetic testing 02/27/2022   Invasive lobular carcinoma of right breast in female [facility]) 11/22/2021   Hypertension, essential 11/18/2021   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 ER/PR positive, HER2 negative. Rt lumpectomy and SLNB 0/4 LN positive with Dr. Delane Fear on 12/12/21 followed by XRT? and antiestrogen therapy. 70cc drained from axillary seroma on 12/27/21  PRECAUTIONS: right UE Lymphedema risk, None  SUBJECTIVE: Pt returns for her last 3 month L-Dex screen.   PAIN:  Are you having pain? No  SOZO SCREENING: Patient was assessed today using the SOZO machine to determine the lymphedema index score. This was compared to her baseline score. It was determined that she is within the recommended range when compared to her baseline and no further action is needed at this time. She will continue SOZO screenings. These are done every 3 months for 2 years post operatively followed by every 6 months for 2 years, and then annually.  L-DEX FLOWSHEETS - 10/01/23 1000       L-DEX LYMPHEDEMA SCREENING   Measurement Type Unilateral    L-DEX MEASUREMENT EXTREMITY Upper Extremity    POSITION  Standing    DOMINANT SIDE Right    At Risk Side Right    BASELINE SCORE (UNILATERAL) 3.3    L-DEX SCORE (UNILATERAL) 2.6    VALUE CHANGE (UNILAT) -0.7            P: Pt to begin 6 month L-Dex screens next.  Denyce Flank, PTA 10/01/2023, 10:51 AM

## 2023-10-02 ENCOUNTER — Encounter (HOSPITAL_COMMUNITY): Payer: Self-pay

## 2023-10-02 ENCOUNTER — Ambulatory Visit (HOSPITAL_COMMUNITY)
Admission: RE | Admit: 2023-10-02 | Discharge: 2023-10-02 | Disposition: A | Discharge status: Home / Self Care | Payer: Medicare Other | Source: Ambulatory Visit | Attending: Hematology | Admitting: Hematology

## 2023-10-02 DIAGNOSIS — R92333 Mammographic heterogeneous density, bilateral breasts: Secondary | ICD-10-CM | POA: Diagnosis not present

## 2023-10-02 DIAGNOSIS — Z9889 Other specified postprocedural states: Secondary | ICD-10-CM

## 2023-10-02 DIAGNOSIS — Z170 Estrogen receptor positive status [ER+]: Secondary | ICD-10-CM | POA: Diagnosis not present

## 2023-10-02 DIAGNOSIS — C50411 Malignant neoplasm of upper-outer quadrant of right female breast: Secondary | ICD-10-CM | POA: Diagnosis not present

## 2023-10-02 DIAGNOSIS — C50919 Malignant neoplasm of unspecified site of unspecified female breast: Secondary | ICD-10-CM | POA: Insufficient documentation

## 2023-12-05 ENCOUNTER — Inpatient Hospital Stay: Payer: Medicare Other | Attending: Hematology

## 2023-12-05 DIAGNOSIS — Z1732 Human epidermal growth factor receptor 2 negative status: Secondary | ICD-10-CM | POA: Insufficient documentation

## 2023-12-05 DIAGNOSIS — C50919 Malignant neoplasm of unspecified site of unspecified female breast: Secondary | ICD-10-CM

## 2023-12-05 DIAGNOSIS — Z170 Estrogen receptor positive status [ER+]: Secondary | ICD-10-CM | POA: Diagnosis not present

## 2023-12-05 DIAGNOSIS — Z1721 Progesterone receptor positive status: Secondary | ICD-10-CM | POA: Diagnosis not present

## 2023-12-05 DIAGNOSIS — M8580 Other specified disorders of bone density and structure, unspecified site: Secondary | ICD-10-CM | POA: Insufficient documentation

## 2023-12-05 DIAGNOSIS — Z800 Family history of malignant neoplasm of digestive organs: Secondary | ICD-10-CM | POA: Insufficient documentation

## 2023-12-05 DIAGNOSIS — C50211 Malignant neoplasm of upper-inner quadrant of right female breast: Secondary | ICD-10-CM | POA: Insufficient documentation

## 2023-12-05 LAB — CBC WITH DIFFERENTIAL/PLATELET
Abs Immature Granulocytes: 0.02 10*3/uL (ref 0.00–0.07)
Basophils Absolute: 0.1 10*3/uL (ref 0.0–0.1)
Basophils Relative: 1 %
Eosinophils Absolute: 0.1 10*3/uL (ref 0.0–0.5)
Eosinophils Relative: 1 %
HCT: 38.9 % (ref 36.0–46.0)
Hemoglobin: 12.5 g/dL (ref 12.0–15.0)
Immature Granulocytes: 0 %
Lymphocytes Relative: 36 %
Lymphs Abs: 2.4 10*3/uL (ref 0.7–4.0)
MCH: 29 pg (ref 26.0–34.0)
MCHC: 32.1 g/dL (ref 30.0–36.0)
MCV: 90.3 fL (ref 80.0–100.0)
Monocytes Absolute: 0.6 10*3/uL (ref 0.1–1.0)
Monocytes Relative: 10 %
Neutro Abs: 3.4 10*3/uL (ref 1.7–7.7)
Neutrophils Relative %: 52 %
Platelets: 351 10*3/uL (ref 150–400)
RBC: 4.31 MIL/uL (ref 3.87–5.11)
RDW: 12.4 % (ref 11.5–15.5)
WBC: 6.7 10*3/uL (ref 4.0–10.5)
nRBC: 0 % (ref 0.0–0.2)

## 2023-12-05 LAB — COMPREHENSIVE METABOLIC PANEL WITH GFR
ALT: 19 U/L (ref 0–44)
AST: 21 U/L (ref 15–41)
Albumin: 4 g/dL (ref 3.5–5.0)
Alkaline Phosphatase: 83 U/L (ref 38–126)
Anion gap: 12 (ref 5–15)
BUN: 10 mg/dL (ref 8–23)
CO2: 24 mmol/L (ref 22–32)
Calcium: 9.8 mg/dL (ref 8.9–10.3)
Chloride: 99 mmol/L (ref 98–111)
Creatinine, Ser: 0.75 mg/dL (ref 0.44–1.00)
GFR, Estimated: >60 mL/min (ref >60)
Glucose, Bld: 106 mg/dL — ABNORMAL HIGH (ref 70–99)
Potassium: 4.3 mmol/L (ref 3.5–5.1)
Sodium: 135 mmol/L (ref 135–145)
Total Bilirubin: 0.5 mg/dL (ref 0.0–1.2)
Total Protein: 7.5 g/dL (ref 6.5–8.1)

## 2023-12-11 NOTE — Progress Notes (Signed)
 [facility] [address], [place] [address]    Clinic Day:  12/12/2023  Referring physician: Artemisa Bile, Foster  Patient Care Team: Paula Bile, Foster as PCP - General Rourk, Windsor Hatcher, Foster as Consulting Physician (Gastroenterology) [name] as Medical Oncologist (Medical Oncology) Gerhard Knuckles, RN as Oncology Nurse Navigator (Medical Oncology)   ASSESSMENT & PLAN:   Assessment: Stage Ia (T1BN0G1) right invasive lobular breast cancer, ER/PR positive, HER2 negative: - Screening mammogram on 09/23/2021 with distortion in the right breast. - Diagnostic mammogram on 10/11/2021 with suspicious right breast mass with spiculated margins at 1 o'clock position measuring 8 x 8 x 5 mm.  Right axilla demonstrates few borderline lymph nodes with nodular areas of cortical thickening up to 4-5 mm. - Right breast 1:00 biopsy and right axillary lymph node biopsy on 10/18/2021 - Pathology: Invasive lobular carcinoma, grade 1, E-cadherin negative.  HER2 0, ER 60%, PR 50%, Ki-67 2%.  Right axilla lymph node with no metastatic carcinoma identified. - Lumpectomy by Dr. Delane Foster on 12/12/2021. - Pathology: 0.8 cm invasive lobular carcinoma, grade 2, margins negative.  0/4 sentinel lymph nodes involved.  ER 60% strong positive, PR 60% strong positive, HER2 negative, IHC 0, Ki-67 2%. - Anastrozole  started on 01/09/2022.  XRT was not offered by radiation oncology.   Social/history: - Lives at home with her husband.  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  Non-smoker. - Mother and sister had colon cancers.  Plan: Stage Ia (T1BN0G2) right breast ILC, ER/PR positive, HER2 negative: -She is tolerating anastrozole  without any major hot flashes or musculoskeletal symptoms.  She has knee arthritic pains which are stable.  She has occasional tenderness over the right breast upper quadrant since surgery which is stable. - Physical exam: Right breast lumpectomy scar on the upper outer quadrant  is stable.  No palpable masses or adenopathy. - Labs from 12/05/2023: Normal LFTs and creatinine.  CBC normal. - Mammogram from 10/02/2023: BI-RADS Category 2. - Continue anastrozole  daily.  RTC 6 months for follow-up with repeat labs.  2.  Osteopenia (DEXA scan from 06/21/2021 T score -1.9): - I reviewed DEXA scan from 07/10/2023 with T-score -2.1. - Continue calcium and vitamin D  supplements.  Last vitamin D  level is normal at 57.8.   Breast Cancer therapy associated bone loss: I have recommended calcium, Vitamin D  and weight bearing exercises.  Orders Placed This Encounter  Procedures   CBC with Differential    Standing Status:   Future    Expected Date:   06/09/2024    Expiration Date:   12/11/2024   Comprehensive metabolic panel    Standing Status:   Future    Expected Date:   06/09/2024    Expiration Date:   12/11/2024   VITAMIN D  25 Hydroxy (Vit-D Deficiency, Fractures)    Standing Status:   Future    Expected Date:   06/09/2024    Expiration Date:   12/11/2024      [name],acting as a scribe for [name].,have documented all relevant documentation on the behalf of [name],as directed by  [name] while in the presence of [name].  I, Paula Boros Foster, have reviewed the above documentation for accuracy and completeness, and I agree with the above.    [name]   4/23/202512:26 PM  CHIEF COMPLAINT:   Diagnosis: Invasive lobular carcinoma of right breast in female    Cancer Staging  Invasive  lobular carcinoma of right breast in female [facility]) Staging form: Breast, AJCC 8th Edition - Clinical stage from 11/22/2021: Stage IA (cT1b, cN0, cM0, G1, ER+, PR+, HER2-) - Unsigned    Prior Therapy: Lumpectomy  Current Therapy:  Anastrozole    HISTORY OF PRESENT ILLNESS:   Oncology History   No history exists.     INTERVAL HISTORY:   [name] is a 73 y.o. female presenting to clinic  today for follow up of right breast cancer. She was last seen by me on 06/07/23.  Since her last visit, she underwent a bilateral MM on 10/02/23 that found: Stable lumpectomy changes in the right breast. No mammographic evidence of malignancy in either breast.  [name] also had bone density scan on 07/10/23 with a T-score of -2.1, which is considered osteopenic.   Today, she states that she is doing well overall. Her appetite level is at 70%. Her energy level is at 75%.   PAST MEDICAL HISTORY:   Past Medical History: Past Medical History:  Diagnosis Date   Abnormal Pap smear    Arthritis    Colon polyps    Hx of seasonal allergies    Hypertension    Postmenopausal HRT (hormone replacement therapy) 11/14/2012   Vaginal Pap smear, abnormal     Surgical History: Past Surgical History:  Procedure Laterality Date   BREAST LUMPECTOMY WITH RADIOACTIVE SEED AND SENTINEL LYMPH NODE BIOPSY Right 12/12/2021   Procedure: RIGHT BREAST LUMPECTOMY WITH RADIOACTIVE SEED AND RIGHT AXILLARY SENTINEL LYMPH NODE BIOPSY;  Surgeon: Enid Harry, Foster;  Location: [facility];  Service: General;  Laterality: Right;   COLONOSCOPY N/A 04/15/2014   Procedure: COLONOSCOPY;  Surgeon: Suzette Espy, Foster;  [facility];  Service: Endoscopy;  Laterality: N/A;  9:30 AM   COLONOSCOPY N/A 06/06/2017   Procedure: COLONOSCOPY;  Surgeon: Suzette Espy, Foster;  [facility];  Service: Endoscopy;  Laterality: N/A;  8:30 AM   COLONOSCOPY WITH PROPOFOL  N/A 01/13/2021   Procedure: COLONOSCOPY WITH PROPOFOL ;  Surgeon: Suzette Espy, Foster;  [facility];  Service: Endoscopy;  Laterality: N/A;  am   LT KNEE SURGERY     POLYPECTOMY  06/06/2017   Procedure: POLYPECTOMY;  Surgeon: Suzette Espy, Foster;  [facility];  Service: Endoscopy;;  Ascending colon x2 (HS)   POLYPECTOMY  01/13/2021   Procedure: POLYPECTOMY INTESTINAL;  Surgeon: Suzette Espy, Foster;  [facility];  Service: Endoscopy;;   SHOULDER SURGERY     thumb surger     WRIST SURGERY      Social History: Social History   Socioeconomic History   Marital status: Married    Spouse name: Not on file   Number of children: Not on file   Years of education: Not on file   Highest education level: Not on file  Occupational History   Not on file  Tobacco Use   Smoking status: Never   Smokeless tobacco: Never  Vaping Use   Vaping status: Never Used  Substance and Sexual Activity   Alcohol use: No    Comment: occ   Drug use: No   Sexual activity: Yes    Birth control/protection: None, Post-menopausal  Other Topics Concern   Not on file  Social History Narrative   Not on file   Social Drivers of Health   Financial Resource Strain: Not on file  Food Insecurity: Not on file  Transportation Needs: Not on file  Physical  Activity: Not on file  Stress: Not on file  Social Connections: Not on file  Intimate Partner Violence: Not on file    Family History: Family History  Problem Relation Age of Onset   Diabetes Mother    Colon cancer Mother 18   Colon cancer Sister        dx 30s d. over 71   Depression Sister    Arthritis Sister    Diabetes Maternal Aunt     Current Medications:  Current Outpatient Medications:    acetaminophen  (TYLENOL ) 500 MG tablet, Take 500 mg by mouth as needed for mild pain. Only as needed, Disp: , Rfl:    Calcium Carbonate-Vitamin D  (CALCIUM 600 + D PO), Take 1 tablet by mouth daily., Disp: , Rfl:    cetirizine (ZYRTEC) 10 MG tablet, Take 10 mg by mouth daily as needed for allergies. As needed, Disp: , Rfl:    cholecalciferol (VITAMIN D3) 25 MCG (1000 UNIT) tablet, Take 1,000 Units by mouth daily., Disp: , Rfl:    Multiple Vitamins-Minerals (ONE-A-DAY WOMENS 50 PLUS PO), Take 1 tablet by mouth daily., Disp: , Rfl:    valsartan (DIOVAN) 80 MG tablet, Take 80 mg by mouth at bedtime., Disp: , Rfl:    anastrozole  (ARIMIDEX ) 1 MG tablet, Take 1 tablet  (1 mg total) by mouth daily., Disp: 90 tablet, Rfl: 6   Allergies: No Known Allergies  REVIEW OF SYSTEMS:   Review of Systems  Constitutional:  Negative for chills, fatigue and fever.  HENT:   Negative for lump/mass, mouth sores, nosebleeds, sore throat and trouble swallowing.   Eyes:  Negative for eye problems.  Respiratory:  Negative for cough and shortness of breath.   Cardiovascular:  Negative for chest pain, leg swelling and palpitations.  Gastrointestinal:  Negative for abdominal pain, constipation, diarrhea, nausea and vomiting.  Genitourinary:  Negative for bladder incontinence, difficulty urinating, dysuria, frequency, hematuria and nocturia.   Musculoskeletal:  Negative for arthralgias, back pain, flank pain, myalgias and neck pain.  Skin:  Negative for itching and rash.  Neurological:  Positive for dizziness. Negative for headaches and numbness.  Hematological:  Does not bruise/bleed easily.  Psychiatric/Behavioral:  Negative for depression, sleep disturbance and suicidal ideas. The patient is not nervous/anxious.   All other systems reviewed and are negative.    VITALS:   Blood pressure (!) 141/71, pulse 74, temperature 98 F (36.7 C), temperature source Oral, resp. rate 16, weight 128 lb 4.9 oz (58.2 kg), SpO2 94%.  Wt Readings from Last 3 Encounters:  12/12/23 128 lb 4.9 oz (58.2 kg)  10/01/23 129 lb 4 oz (58.6 kg)  07/02/23 128 lb (58.1 kg)    Body mass index is 23.47 kg/m.  Performance status (ECOG): 1 - Symptomatic but completely ambulatory  PHYSICAL EXAM:   Physical Exam Vitals and nursing note reviewed. Exam conducted with a chaperone present.  Constitutional:      Appearance: Normal appearance.  Cardiovascular:     Rate and Rhythm: Normal rate and regular rhythm.     Pulses: Normal pulses.     Heart sounds: Normal heart sounds.  Pulmonary:     Effort: Pulmonary effort is normal.     Breath sounds: Normal breath sounds.  Abdominal:     Palpations:  Abdomen is soft. There is no hepatomegaly, splenomegaly or mass.     Tenderness: There is no abdominal tenderness.  Musculoskeletal:     Right lower leg: No edema.     Left lower leg:  No edema.  Lymphadenopathy:     Cervical: No cervical adenopathy.     Right cervical: No superficial, deep or posterior cervical adenopathy.    Left cervical: No superficial, deep or posterior cervical adenopathy.     Upper Body:     Right upper body: No supraclavicular or axillary adenopathy.     Left upper body: No supraclavicular or axillary adenopathy.  Neurological:     General: No focal deficit present.     Mental Status: She is alert and oriented to person, place, and time.  Psychiatric:        Mood and Affect: Mood normal.        Behavior: Behavior normal.   Breast Exam Chaperone: Franco Isaac, RN   LABS:      Latest Ref Rng & Units 12/05/2023   10:42 AM 05/30/2023    9:27 AM 11/21/2022   10:14 AM  CBC  WBC 4.0 - 10.5 K/uL 6.7  7.0  5.4   Hemoglobin 12.0 - 15.0 g/dL 16.1  09.6  04.5   Hematocrit 36.0 - 46.0 % 38.9  37.9  36.4   Platelets 150 - 400 K/uL 351  342  327       Latest Ref Rng & Units 12/05/2023   10:42 AM 05/30/2023    9:27 AM 11/21/2022   10:14 AM  CMP  Glucose 70 - 99 mg/dL 409  811  914   BUN 8 - 23 mg/dL 10  9  7    Creatinine 0.44 - 1.00 mg/dL 7.82  9.56  2.13   Sodium 135 - 145 mmol/L 135  135  136   Potassium 3.5 - 5.1 mmol/L 4.3  4.2  3.9   Chloride 98 - 111 mmol/L 99  101  104   CO2 22 - 32 mmol/L 24  26  23    Calcium 8.9 - 10.3 mg/dL 9.8  9.3  8.9   Total Protein 6.5 - 8.1 g/dL 7.5  7.2  7.2   Total Bilirubin 0.0 - 1.2 mg/dL 0.5  0.6  0.4   Alkaline Phos 38 - 126 U/L 83  81  85   AST 15 - 41 U/L 21  19  17    ALT 0 - 44 U/L 19  17  17       No results found for: "CEA1", "CEA" / No results found for: "CEA1", "CEA" No results found for: "PSA1" No results found for: "YQM578" No results found for: "CAN125"  No results found for: "TOTALPROTELP", "ALBUMINELP",  "A1GS", "A2GS", "BETS", "BETA2SER", "GAMS", "MSPIKE", "SPEI" No results found for: "TIBC", "FERRITIN", "IRONPCTSAT" No results found for: "LDH"   STUDIES:   No results found.

## 2023-12-12 ENCOUNTER — Inpatient Hospital Stay (HOSPITAL_BASED_OUTPATIENT_CLINIC_OR_DEPARTMENT_OTHER): Payer: Medicare Other | Admitting: Hematology

## 2023-12-12 VITALS — BP 141/71 | HR 74 | Temp 98.0°F | Resp 16 | Wt 128.3 lb

## 2023-12-12 DIAGNOSIS — C50211 Malignant neoplasm of upper-inner quadrant of right female breast: Secondary | ICD-10-CM | POA: Diagnosis not present

## 2023-12-12 DIAGNOSIS — E559 Vitamin D deficiency, unspecified: Secondary | ICD-10-CM | POA: Diagnosis not present

## 2023-12-12 DIAGNOSIS — Z170 Estrogen receptor positive status [ER+]: Secondary | ICD-10-CM | POA: Diagnosis not present

## 2023-12-12 DIAGNOSIS — C50919 Malignant neoplasm of unspecified site of unspecified female breast: Secondary | ICD-10-CM

## 2023-12-12 DIAGNOSIS — Z800 Family history of malignant neoplasm of digestive organs: Secondary | ICD-10-CM | POA: Diagnosis not present

## 2023-12-12 DIAGNOSIS — M8580 Other specified disorders of bone density and structure, unspecified site: Secondary | ICD-10-CM | POA: Diagnosis not present

## 2023-12-12 DIAGNOSIS — Z1732 Human epidermal growth factor receptor 2 negative status: Secondary | ICD-10-CM | POA: Diagnosis not present

## 2023-12-12 DIAGNOSIS — Z1721 Progesterone receptor positive status: Secondary | ICD-10-CM | POA: Diagnosis not present

## 2023-12-12 MED ORDER — ANASTROZOLE 1 MG PO TABS: 1.0000 mg | ORAL_TABLET | Freq: Every day | ORAL | 6 refills | Status: AC

## 2023-12-12 NOTE — Patient Instructions (Addendum)
 [facility] at [facility] Discharge Instructions   You were seen and examined today by Dr. Cheree Cords.  He reviewed the results of your lab work which are normal.   Your mammogram in February did not show any evidence of cancer.   Continue anastrozole  as prescribed.   Continue calcium and vitamin D .   We will see you back in 6 months. We will repeat lab work prior to this visit.    Return as scheduled.    Thank you for choosing  Cancer Center at [facility] to provide your oncology and hematology care.  To afford each patient quality time with our provider, please arrive at least 15 minutes before your scheduled appointment time.   If you have a lab appointment with the Cancer Center please come in thru the Main Entrance and check in at the main information desk.  You need to re-schedule your appointment should you arrive 10 or more minutes late.  We strive to give you quality time with our providers, and arriving late affects you and other patients whose appointments are after yours.  Also, if you no show three or more times for appointments you may be dismissed from the clinic at the providers discretion.     Again, thank you for choosing [facility].  Our hope is that these requests will decrease the amount of time that you wait before being seen by our physicians.       _____________________________________________________________  Should you have questions after your visit to [facility], please contact our office at [phone] and follow the prompts.  Our office hours are 8:00 a.m. and 4:30 p.m. Monday - Friday.  Please note that voicemails left after 4:00 p.m. may not be returned until the following business day.  We are closed weekends and major holidays.  You do have access to a nurse 24-7, just call the main number to the clinic [phone] and do not press any options, hold on the line and a nurse will  answer the phone.    For prescription refill requests, have your pharmacy contact our office and allow 72 hours.    Due to Covid, you will need to wear a mask upon entering the hospital. If you do not have a mask, a mask will be given to you at the Main Entrance upon arrival. For doctor visits, patients may have 1 support person age 36 or older with them. For treatment visits, patients can not have anyone with them due to social distancing guidelines and our immunocompromised population.

## 2024-01-07 DIAGNOSIS — R030 Elevated blood-pressure reading, without diagnosis of hypertension: Secondary | ICD-10-CM | POA: Diagnosis not present

## 2024-01-07 DIAGNOSIS — I10 Essential (primary) hypertension: Secondary | ICD-10-CM | POA: Diagnosis not present

## 2024-01-15 ENCOUNTER — Ambulatory Visit: Payer: Medicare Other | Attending: General Surgery

## 2024-01-15 VITALS — Wt 129.0 lb

## 2024-01-15 DIAGNOSIS — Z483 Aftercare following surgery for neoplasm: Secondary | ICD-10-CM | POA: Insufficient documentation

## 2024-01-15 DIAGNOSIS — H43393 Other vitreous opacities, bilateral: Secondary | ICD-10-CM | POA: Diagnosis not present

## 2024-01-15 NOTE — Therapy (Signed)
 OUTPATIENT PHYSICAL THERAPY SOZO SCREENING NOTE   Patient Name: [name] MRN: [id number] DOB:Apr 03, 1951, 73 y.o., female Today's Date: 01/15/2024  PCP: Artemisa Bile, MD REFERRING PROVIDER: Enid Harry, MD   PT End of Session - 01/15/24 1048     Visit Number 2   # unchanged due to screen only   PT Start Time 1046    PT Stop Time 1049    PT Time Calculation (min) 3 min    Activity Tolerance Patient tolerated treatment well    Behavior During Therapy [facility] for tasks assessed/performed             Past Medical History:  Diagnosis Date   Abnormal Pap smear    Arthritis    Colon polyps    Hx of seasonal allergies    Hypertension    Postmenopausal HRT (hormone replacement therapy) 11/14/2012   Vaginal Pap smear, abnormal    Past Surgical History:  Procedure Laterality Date   BREAST LUMPECTOMY WITH RADIOACTIVE SEED AND SENTINEL LYMPH NODE BIOPSY Right 12/12/2021   Procedure: RIGHT BREAST LUMPECTOMY WITH RADIOACTIVE SEED AND RIGHT AXILLARY SENTINEL LYMPH NODE BIOPSY;  Surgeon: Enid Harry, MD;  Location: [facility];  Service: General;  Laterality: Right;   COLONOSCOPY N/A 04/15/2014   Procedure: COLONOSCOPY;  Surgeon: Suzette Espy, MD;  [facility];  Service: Endoscopy;  Laterality: N/A;  9:30 AM   COLONOSCOPY N/A 06/06/2017   Procedure: COLONOSCOPY;  Surgeon: Suzette Espy, MD;  [facility];  Service: Endoscopy;  Laterality: N/A;  8:30 AM   COLONOSCOPY WITH PROPOFOL  N/A 01/13/2021   Procedure: COLONOSCOPY WITH PROPOFOL ;  Surgeon: Suzette Espy, MD;  [facility];  Service: Endoscopy;  Laterality: N/A;  am   LT KNEE SURGERY     POLYPECTOMY  06/06/2017   Procedure: POLYPECTOMY;  Surgeon: Suzette Espy, MD;  [facility];  Service: Endoscopy;;  Ascending colon x2 (HS)   POLYPECTOMY  01/13/2021   Procedure: POLYPECTOMY INTESTINAL;  Surgeon: Suzette Espy, MD;  [facility];  Service:  Endoscopy;;   SHOULDER SURGERY     thumb surger     WRIST SURGERY     Patient Active Problem List   Diagnosis Date Noted   Genetic testing 02/27/2022   Invasive lobular carcinoma of right breast in female [facility]) 11/22/2021   Hypertension, essential 11/18/2021   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 ER/PR positive, HER2 negative. Rt lumpectomy and SLNB 0/4 LN positive with Dr. Delane Fear on 12/12/21 followed by XRT? and antiestrogen therapy. 70cc drained from axillary seroma on 12/27/21  PRECAUTIONS: right UE Lymphedema risk, None  SUBJECTIVE: Pt returns for her last 3 month L-Dex screen.   PAIN:  Are you having pain? No  SOZO SCREENING: Patient was assessed today using the SOZO machine to determine the lymphedema index score. This was compared to her baseline score. It was determined that she is within the recommended range when compared to her baseline and no further action is needed at this time. She will continue SOZO screenings. These are done every 3 months for 2 years post operatively followed by every 6 months for 2 years, and then annually.  L-DEX FLOWSHEETS - 01/15/24 1000       L-DEX LYMPHEDEMA SCREENING   Measurement Type Unilateral    L-DEX MEASUREMENT EXTREMITY Upper Extremity    POSITION  Standing    DOMINANT SIDE Right    At Risk Side Right    BASELINE SCORE (UNILATERAL) 3.3    L-DEX SCORE (UNILATERAL) 2.9    VALUE CHANGE (UNILAT) -0.4            P: Pt to begin 6 month L-Dex screens next.  Denyce Flank, PTA 01/15/2024, 10:48 AM

## 2024-02-14 DIAGNOSIS — C50911 Malignant neoplasm of unspecified site of right female breast: Secondary | ICD-10-CM | POA: Diagnosis not present

## 2024-06-09 ENCOUNTER — Inpatient Hospital Stay: Attending: Physician Assistant

## 2024-06-09 ENCOUNTER — Inpatient Hospital Stay

## 2024-06-09 DIAGNOSIS — Z800 Family history of malignant neoplasm of digestive organs: Secondary | ICD-10-CM | POA: Diagnosis not present

## 2024-06-09 DIAGNOSIS — Z79899 Other long term (current) drug therapy: Secondary | ICD-10-CM | POA: Diagnosis not present

## 2024-06-09 DIAGNOSIS — C50211 Malignant neoplasm of upper-inner quadrant of right female breast: Secondary | ICD-10-CM | POA: Insufficient documentation

## 2024-06-09 DIAGNOSIS — Z79811 Long term (current) use of aromatase inhibitors: Secondary | ICD-10-CM | POA: Insufficient documentation

## 2024-06-09 DIAGNOSIS — E559 Vitamin D deficiency, unspecified: Secondary | ICD-10-CM

## 2024-06-09 DIAGNOSIS — M8580 Other specified disorders of bone density and structure, unspecified site: Secondary | ICD-10-CM | POA: Diagnosis not present

## 2024-06-09 DIAGNOSIS — Z1732 Human epidermal growth factor receptor 2 negative status: Secondary | ICD-10-CM | POA: Diagnosis not present

## 2024-06-09 DIAGNOSIS — Z1721 Progesterone receptor positive status: Secondary | ICD-10-CM | POA: Diagnosis not present

## 2024-06-09 DIAGNOSIS — C50919 Malignant neoplasm of unspecified site of unspecified female breast: Secondary | ICD-10-CM

## 2024-06-09 DIAGNOSIS — Z170 Estrogen receptor positive status [ER+]: Secondary | ICD-10-CM | POA: Diagnosis not present

## 2024-06-09 LAB — CBC WITH DIFFERENTIAL/PLATELET
Abs Immature Granulocytes: 0.02 K/uL (ref 0.00–0.07)
Basophils Absolute: 0.1 K/uL (ref 0.0–0.1)
Basophils Relative: 1 %
Eosinophils Absolute: 0.2 K/uL (ref 0.0–0.5)
Eosinophils Relative: 3 %
HCT: 37.8 % (ref 36.0–46.0)
Hemoglobin: 12.2 g/dL (ref 12.0–15.0)
Immature Granulocytes: 0 %
Lymphocytes Relative: 27 %
Lymphs Abs: 2 K/uL (ref 0.7–4.0)
MCH: 29.4 pg (ref 26.0–34.0)
MCHC: 32.3 g/dL (ref 30.0–36.0)
MCV: 91.1 fL (ref 80.0–100.0)
Monocytes Absolute: 0.6 K/uL (ref 0.1–1.0)
Monocytes Relative: 8 %
Neutro Abs: 4.4 K/uL (ref 1.7–7.7)
Neutrophils Relative %: 61 %
Platelets: 341 K/uL (ref 150–400)
RBC: 4.15 MIL/uL (ref 3.87–5.11)
RDW: 12.4 % (ref 11.5–15.5)
WBC: 7.2 K/uL (ref 4.0–10.5)
nRBC: 0 % (ref 0.0–0.2)

## 2024-06-09 LAB — COMPREHENSIVE METABOLIC PANEL WITH GFR
ALT: 19 U/L (ref 0–44)
AST: 22 U/L (ref 15–41)
Albumin: 4.5 g/dL (ref 3.5–5.0)
Alkaline Phosphatase: 87 U/L (ref 38–126)
Anion gap: 13 (ref 5–15)
BUN: 9 mg/dL (ref 8–23)
CO2: 23 mmol/L (ref 22–32)
Calcium: 8.8 mg/dL — ABNORMAL LOW (ref 8.9–10.3)
Chloride: 101 mmol/L (ref 98–111)
Creatinine, Ser: 0.68 mg/dL (ref 0.44–1.00)
GFR, Estimated: >60 mL/min (ref >60)
Glucose, Bld: 100 mg/dL — ABNORMAL HIGH (ref 70–99)
Potassium: 4.1 mmol/L (ref 3.5–5.1)
Sodium: 137 mmol/L (ref 135–145)
Total Bilirubin: 0.2 mg/dL (ref 0.0–1.2)
Total Protein: 7.4 g/dL (ref 6.5–8.1)

## 2024-06-09 LAB — VITAMIN D 25 HYDROXY (VIT D DEFICIENCY, FRACTURES): Vit D, 25-Hydroxy: 56.56 ng/mL (ref 30–100)

## 2024-06-12 NOTE — Progress Notes (Signed)
 [facility] [address], [place] [address]   CLINIC:  Medical Oncology/Hematology  PCP:  Sheryle Carwin, MD [address] / [place] [address] [phone]   REASON FOR VISIT:  Follow-up for stage Ia RIGHT breast cancer (2023)  PRIOR THERAPY: - Lumpectomy (12/12/2021)  CURRENT THERAPY: Anastrozole  (since 01/09/2022)  BRIEF ONCOLOGIC HISTORY:  CANCER STAGING:  Cancer Staging  Invasive lobular carcinoma of right breast in female [facility]) Staging form: Breast, AJCC 8th Edition - Clinical stage from 11/22/2021: Stage IA (cT1b, cN0, cM0, G1, ER+, PR+, HER2-) - Unsigned   INTERVAL HISTORY:   [name], a 73 y.o. female, returns for routine follow-up of her right-sided breast cancer.  [name] was last seen on 12/12/2023 by Dr. Rogers.   At today's visit, she  reports feeling fair, apart from fatigue and poor appetite which is new for her. She denies any recent hospitalizations, surgeries, or changes in her  baseline health status. She reports 50% energy and 50% appetite.   She is maintaining stable weight at this time.  She denies any new breast lumps or axillary lymphadenopathy. She has occasional tenderness over right breast upper quadrant, since lumpectomy surgery, which is stable. No new onset pains, but has chronic arthritic pain in her knee which is stable.   No new headaches or abdominal pain.  She continues to take Arimidex , which she is tolerating well without any major hot flashes or musculoskeletal symptoms.   She takes calcium and vitamin D .  ASSESSMENT & PLAN:  1.  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 - Diagnostic mammogram on 10/11/2021 with suspicious right breast mass with spiculated margins at 1 o'clock position measuring 8 x 8 x 5 mm.  Right axilla demonstrates few borderline lymph nodes with nodular  areas of cortical thickening up to 4-5 mm. - Right breast 1:00 biopsy and right axillary lymph node biopsy on 10/18/2021 - Pathology: Invasive lobular carcinoma, grade 1, E-cadherin negative.  HER2 0, ER 60%, PR 50%, Ki-67 2%.  Right axilla lymph node with no metastatic carcinoma identified. - Lumpectomy by Dr. Ebbie on 12/12/2021. - Pathology: 0.8 cm invasive lobular carcinoma, grade 2, margins negative.  0/4 sentinel lymph nodes involved.  ER 60% strong positive, PR 60% strong positive, HER2 negative, IHC 0, Ki-67 2%. - Anastrozole  started on 01/09/2022.  XRT was not offered by radiation oncology.  - She is tolerating anastrozole  without any major hot flashes or musculoskeletal symptoms. - Most recent mammogram (10/02/2023): BI-RADS Category 2, benign - Physical exam (06/16/24): Right breast lumpectomy scar on the upper outer quadrant is stable.  No palpable masses or adenopathy. - Labs from 06/09/2024: Normal LFTs and creatinine.  CBC normal. - No red flag symptoms per today's visit - PLAN: Continue daily anastrozole . - Screening mammogram due 10/02/2024  - Labs and office visit in 6 months  2.  Osteopenia (DEXA scan from 06/21/2021 T score -1.9): - DEXA/bone density (07/07/2021) with T-score -1.9, osteopenic - Started anastrozole  in May 2023 - Most recent DEXA/bone density (07/10/2023): T-score -2.1, osteopenic - She takes calcium (600 mg daily + multivitamin) and vitamin D  supplements (400 units combined with calcium supplement + 1,000 units D3)  - Most recent lab abs (06/09/2024): Vitamin D  normal at 56.56, calcium mildly low 8.8  - PLAN: Recommend combined calcium (600 mg) / vitamin D  (400 units) supplement TWICE daily. - Continue  weightbearing exercises  - Repeat DEXA scan due in November 2026  3.  Social/history: - Lives at home with her husband.  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  Non-smoker. - Mother and sister had colon cancers.  PLAN SUMMARY: >> Screening mammogram due  10/02/2024 >> Labs in 6 months = CBC/D, CMP, vitamin D  >> OFFICE visit in 6 months    REVIEW OF SYSTEMS:   Review of Systems  Constitutional:  Positive for appetite change and fatigue. Negative for chills, diaphoresis, fever and unexpected weight change.  HENT:   Negative for lump/mass and nosebleeds.   Eyes:  Negative for eye problems.  Respiratory:  Negative for cough, hemoptysis and shortness of breath.   Cardiovascular:  Negative for chest pain, leg swelling and palpitations.  Gastrointestinal:  Positive for constipation. Negative for abdominal pain, blood in stool, diarrhea, nausea and vomiting.  Genitourinary:  Negative for hematuria.   Skin: Negative.   Neurological:  Positive for dizziness. Negative for headaches and light-headedness.  Hematological:  Does not bruise/bleed easily.    PHYSICAL EXAM:   Performance status (ECOG): 1 - Symptomatic but completely ambulatory  Vitals:   06/16/24 1050 06/16/24 1134  BP: (!) 166/76 130/68  Pulse: 70   Resp: 16   Temp: (!) 97.5 F (36.4 C)   SpO2: 98%    Wt Readings from Last 3 Encounters:  06/16/24 125 lb 7.1 oz (56.9 kg)  01/15/24 129 lb (58.5 kg)  12/12/23 128 lb 4.9 oz (58.2 kg)   Physical Exam Constitutional:      Appearance: Normal appearance. She is normal weight.  Cardiovascular:     Heart sounds: Normal heart sounds.  Pulmonary:     Breath sounds: Normal breath sounds.  Chest:     Comments: Right breast lumpectomy scar on the upper outer quadrant is stable.  No palpable masses or adenopathy. Neurological:     General: No focal deficit present.     Mental Status: Mental status is at baseline.  Psychiatric:        Behavior: Behavior normal. Behavior is cooperative.      PAST MEDICAL/SURGICAL HISTORY:  Past Medical History:  Diagnosis Date   Abnormal Pap smear    Arthritis    Colon polyps    Hx of seasonal allergies    Hypertension    Postmenopausal HRT (hormone replacement therapy) 11/14/2012   Vaginal  Pap smear, abnormal    Past Surgical History:  Procedure Laterality Date   BREAST LUMPECTOMY WITH RADIOACTIVE SEED AND SENTINEL LYMPH NODE BIOPSY Right 12/12/2021   Procedure: RIGHT BREAST LUMPECTOMY WITH RADIOACTIVE SEED AND RIGHT AXILLARY SENTINEL LYMPH NODE BIOPSY;  Surgeon: Ebbie Cough, MD;  Location: [facility];  Service: General;  Laterality: Right;   COLONOSCOPY N/A 04/15/2014   Procedure: COLONOSCOPY;  Surgeon: Lamar CHRISTELLA Hollingshead, MD;  [facility];  Service: Endoscopy;  Laterality: N/A;  9:30 AM   COLONOSCOPY N/A 06/06/2017   Procedure: COLONOSCOPY;  Surgeon: Hollingshead Lamar CHRISTELLA, MD;  [facility];  Service: Endoscopy;  Laterality: N/A;  8:30 AM   COLONOSCOPY WITH PROPOFOL  N/A 01/13/2021   Procedure: COLONOSCOPY WITH PROPOFOL ;  Surgeon: Hollingshead Lamar CHRISTELLA, MD;  [facility];  Service: Endoscopy;  Laterality: N/A;  am   LT KNEE SURGERY     POLYPECTOMY  06/06/2017   Procedure: POLYPECTOMY;  Surgeon: Hollingshead Lamar CHRISTELLA, MD;  [facility];  Service: Endoscopy;;  Ascending colon x2 (HS)   POLYPECTOMY  01/13/2021  Procedure: POLYPECTOMY INTESTINAL;  Surgeon: Shaaron Lamar HERO, MD;  [facility];  Service: Endoscopy;;   SHOULDER SURGERY     thumb surger     WRIST SURGERY      SOCIAL HISTORY:  Social History   Socioeconomic History   Marital status: Married    Spouse name: Not on file   Number of children: Not on file   Years of education: Not on file   Highest education level: Not on file  Occupational History   Not on file  Tobacco Use   Smoking status: Never   Smokeless tobacco: Never  Vaping Use   Vaping status: Never Used  Substance and Sexual Activity   Alcohol use: No    Comment: occ   Drug use: No   Sexual activity: Yes    Birth control/protection: None, Post-menopausal  Other Topics Concern   Not on file  Social History Narrative   Not on file   Social Drivers of Health   Financial Resource Strain:  Not on file  Food Insecurity: Not on file  Transportation Needs: Not on file  Physical Activity: Not on file  Stress: Not on file  Social Connections: Not on file  Intimate Partner Violence: Not on file    FAMILY HISTORY:  Family History  Problem Relation Age of Onset   Diabetes Mother    Colon cancer Mother 110   Colon cancer Sister        dx 30s d. over 41   Depression Sister    Arthritis Sister    Diabetes Maternal Aunt     CURRENT MEDICATIONS:  Current Outpatient Medications  Medication Sig Dispense Refill   acetaminophen  (TYLENOL ) 500 MG tablet Take 500 mg by mouth as needed for mild pain. Only as needed     anastrozole  (ARIMIDEX ) 1 MG tablet Take 1 tablet (1 mg total) by mouth daily. 90 tablet 6   Calcium Carbonate-Vitamin D  (CALCIUM 600 + D PO) Take 1 tablet by mouth daily.     cetirizine (ZYRTEC) 10 MG tablet Take 10 mg by mouth daily as needed for allergies. As needed     cholecalciferol (VITAMIN D3) 25 MCG (1000 UNIT) tablet Take 1,000 Units by mouth daily.     diazepam (VALIUM) 2 MG tablet SMARTSIG:1 Tablet(s) By Mouth Every Evening PRN     meloxicam (MOBIC) 15 MG tablet Take 15 mg by mouth daily.     Multiple Vitamins-Minerals (ONE-A-DAY WOMENS 50 PLUS PO) Take 1 tablet by mouth daily.     valsartan (DIOVAN) 80 MG tablet Take 80 mg by mouth at bedtime.     No current facility-administered medications for this visit.    ALLERGIES:  No Known Allergies  LABORATORY DATA:  I have reviewed the labs as listed.     Latest Ref Rng & Units 06/09/2024    2:00 PM 12/05/2023   10:42 AM 05/30/2023    9:27 AM  CBC  WBC 4.0 - 10.5 K/uL 7.2  6.7  7.0   Hemoglobin 12.0 - 15.0 g/dL 87.7  87.4  87.6   Hematocrit 36.0 - 46.0 % 37.8  38.9  37.9   Platelets 150 - 400 K/uL 341  351  342       Latest Ref Rng & Units 06/09/2024    2:00 PM 12/05/2023   10:42 AM 05/30/2023    9:27 AM  CMP  Glucose 70 - 99 mg/dL 899  893  841   BUN 8 - 23 mg/dL  9  10  9    Creatinine 0.44 - 1.00  mg/dL 9.31  9.24  9.26   Sodium 135 - 145 mmol/L 137  135  135   Potassium 3.5 - 5.1 mmol/L 4.1  4.3  4.2   Chloride 98 - 111 mmol/L 101  99  101   CO2 22 - 32 mmol/L 23  24  26    Calcium 8.9 - 10.3 mg/dL 8.8  9.8  9.3   Total Protein 6.5 - 8.1 g/dL 7.4  7.5  7.2   Total Bilirubin 0.0 - 1.2 mg/dL 0.2  0.5  0.6   Alkaline Phos 38 - 126 U/L 87  83  81   AST 15 - 41 U/L 22  21  19    ALT 0 - 44 U/L 19  19  17      DIAGNOSTIC IMAGING:  I have independently reviewed the scans and discussed with the patient. No results found.   WRAP UP:  All questions were answered. The patient knows to call the clinic with any problems, questions or concerns.  Medical decision making: Moderate  Time spent on visit: I spent 20 minutes counseling the patient face to face. The total time spent in the appointment was 30 minutes and more than 50% was on counseling.  Pleasant CHRISTELLA Barefoot, PA-C  06/16/24 11:37 AM

## 2024-06-16 ENCOUNTER — Inpatient Hospital Stay: Admitting: Physician Assistant

## 2024-06-16 ENCOUNTER — Other Ambulatory Visit (HOSPITAL_COMMUNITY): Payer: Self-pay | Admitting: Physician Assistant

## 2024-06-16 ENCOUNTER — Ambulatory Visit: Admitting: Hematology

## 2024-06-16 VITALS — BP 130/68 | HR 70 | Temp 97.5°F | Resp 16 | Wt 125.4 lb

## 2024-06-16 DIAGNOSIS — E559 Vitamin D deficiency, unspecified: Secondary | ICD-10-CM

## 2024-06-16 DIAGNOSIS — Z1732 Human epidermal growth factor receptor 2 negative status: Secondary | ICD-10-CM | POA: Diagnosis not present

## 2024-06-16 DIAGNOSIS — Z1721 Progesterone receptor positive status: Secondary | ICD-10-CM | POA: Diagnosis not present

## 2024-06-16 DIAGNOSIS — Z170 Estrogen receptor positive status [ER+]: Secondary | ICD-10-CM | POA: Diagnosis not present

## 2024-06-16 DIAGNOSIS — M8580 Other specified disorders of bone density and structure, unspecified site: Secondary | ICD-10-CM

## 2024-06-16 DIAGNOSIS — C50919 Malignant neoplasm of unspecified site of unspecified female breast: Secondary | ICD-10-CM | POA: Diagnosis not present

## 2024-06-16 DIAGNOSIS — C50211 Malignant neoplasm of upper-inner quadrant of right female breast: Secondary | ICD-10-CM | POA: Diagnosis not present

## 2024-06-16 DIAGNOSIS — C50411 Malignant neoplasm of upper-outer quadrant of right female breast: Secondary | ICD-10-CM | POA: Diagnosis not present

## 2024-06-16 DIAGNOSIS — Z79811 Long term (current) use of aromatase inhibitors: Secondary | ICD-10-CM | POA: Diagnosis not present

## 2024-06-16 NOTE — Patient Instructions (Addendum)
 [facility] at [facility] **VISIT SUMMARY & IMPORTANT INSTRUCTIONS **   You were seen today by Pleasant Barefoot PA-C for your history of right sided breast cancer.    HISTORY OF BREAST CANCER Your most recent labs, physical exam, and mammogram (10/02/2023) did not show any evidence of recurrent breast cancer. Continue to take anastrozole  (Arimidex ) daily. Your next screening mammogram is due in February 2026. We we will see you for follow-up labs and physical exam in 6 months.   OSTEOPENIA Your osteopenia (weak and fragile bones) is related to age and postmenopausal status, as well as your anastrozole  (Arimidex ) pill which you are taking for treatment of your breast cancer. To help prevent worsening bone density, continue to take calcium and vitamin D  supplements. Continue daily multivitamin Stop taking Vitamin D  1,000 units (25 mcg) INCREASE your combined calcium (600 mg) / Vitamin D  (400 units) supplement to take this TWICE daily. Weightbearing exercises will also help to strengthen your bones. We will check another bone density scan in November 2026.  FOLLOW-UP APPOINTMENT: 6 months  ** Thank you for trusting me with your healthcare!  I strive to provide all of my patients with quality care at each visit.  If you receive a survey for this visit, I would be so grateful to you for taking the time to provide feedback.  Thank you in advance!  ~ Ponciano Shealy                                        Dr. Mickiel Davonna Pleasant Barefoot, PA-C          Delon Hope, NP   - - - - - - - - - - - - - - - - - -    Thank you for choosing [facility] at [facility] to provide your oncology and hematology care.  To afford each patient quality time with our provider, please arrive at least 15 minutes before your scheduled appointment time.   If you have a lab appointment with the Cancer Center please come in thru the Main Entrance and check in at the  main information desk.  You need to re-schedule your appointment should you arrive 10 or more minutes late.  We strive to give you quality time with our providers, and arriving late affects you and other patients whose appointments are after yours.  Also, if you no show three or more times for appointments you may be dismissed from the clinic at the providers discretion.     Again, thank you for choosing [facility].  Our hope is that these requests will decrease the amount of time that you wait before being seen by our physicians.       _____________________________________________________________  Should you have questions after your visit to [facility], please contact our office at [phone] and follow the prompts.  Our office hours are 8:00 a.m. and 4:30 p.m. Monday - Friday.  Please note that voicemails left after 4:00 p.m. may not be returned until the following business day.  We are closed weekends and major holidays.  You do have access to a nurse 24-7, just call the main number to the clinic [phone] and do not press any options, hold on the line and a nurse will answer the phone.  For prescription refill requests, have your pharmacy contact our office and allow 72 hours.

## 2024-06-17 DIAGNOSIS — Z23 Encounter for immunization: Secondary | ICD-10-CM | POA: Diagnosis not present

## 2024-07-04 DIAGNOSIS — Z79899 Other long term (current) drug therapy: Secondary | ICD-10-CM | POA: Diagnosis not present

## 2024-07-04 DIAGNOSIS — E785 Hyperlipidemia, unspecified: Secondary | ICD-10-CM | POA: Diagnosis not present

## 2024-07-04 DIAGNOSIS — I10 Essential (primary) hypertension: Secondary | ICD-10-CM | POA: Diagnosis not present

## 2024-07-04 DIAGNOSIS — C50919 Malignant neoplasm of unspecified site of unspecified female breast: Secondary | ICD-10-CM | POA: Diagnosis not present

## 2024-07-04 DIAGNOSIS — R7301 Impaired fasting glucose: Secondary | ICD-10-CM | POA: Diagnosis not present

## 2024-07-10 DIAGNOSIS — I10 Essential (primary) hypertension: Secondary | ICD-10-CM | POA: Diagnosis not present

## 2024-07-10 DIAGNOSIS — E785 Hyperlipidemia, unspecified: Secondary | ICD-10-CM | POA: Diagnosis not present

## 2024-07-10 DIAGNOSIS — Z853 Personal history of malignant neoplasm of breast: Secondary | ICD-10-CM | POA: Diagnosis not present

## 2024-07-10 DIAGNOSIS — R7301 Impaired fasting glucose: Secondary | ICD-10-CM | POA: Diagnosis not present

## 2024-07-10 DIAGNOSIS — Z0001 Encounter for general adult medical examination with abnormal findings: Secondary | ICD-10-CM | POA: Diagnosis not present

## 2024-07-21 ENCOUNTER — Ambulatory Visit: Attending: General Surgery

## 2024-07-21 VITALS — Wt 127.1 lb

## 2024-07-21 DIAGNOSIS — Z483 Aftercare following surgery for neoplasm: Secondary | ICD-10-CM | POA: Insufficient documentation

## 2024-07-21 NOTE — Therapy (Signed)
 OUTPATIENT PHYSICAL THERAPY SOZO SCREENING NOTE   Patient Name: [name] MRN: [id number] DOB:[birth year], 73 y.o., female Today's Date: 07/21/2024  PCP: Sheryle Carwin, MD REFERRING PROVIDER: Sheryle Carwin, MD   PT End of Session - 07/21/24 [phone]     Visit Number 2   # unchanged due to screen only   PT Start Time 0954    PT Stop Time 0958    PT Time Calculation (min) 4 min    Activity Tolerance Patient tolerated treatment well    Behavior During Therapy [facility] for tasks assessed/performed          Past Medical History:  Diagnosis Date   Abnormal Pap smear    Arthritis    Colon polyps    Hx of seasonal allergies    Hypertension    Postmenopausal HRT (hormone replacement therapy) 11/14/2012   Vaginal Pap smear, abnormal    Past Surgical History:  Procedure Laterality Date   BREAST LUMPECTOMY WITH RADIOACTIVE SEED AND SENTINEL LYMPH NODE BIOPSY Right 12/12/2021   Procedure: RIGHT BREAST LUMPECTOMY WITH RADIOACTIVE SEED AND RIGHT AXILLARY SENTINEL LYMPH NODE BIOPSY;  Surgeon: Ebbie Cough, MD;  Location: [facility];  Service: General;  Laterality: Right;   COLONOSCOPY N/A 04/15/2014   Procedure: COLONOSCOPY;  Surgeon: Lamar CHRISTELLA Hollingshead, MD;  [facility];  Service: Endoscopy;  Laterality: N/A;  9:30 AM   COLONOSCOPY N/A 06/06/2017   Procedure: COLONOSCOPY;  Surgeon: Hollingshead Lamar CHRISTELLA, MD;  [facility];  Service: Endoscopy;  Laterality: N/A;  8:30 AM   COLONOSCOPY WITH PROPOFOL  N/A 01/13/2021   Procedure: COLONOSCOPY WITH PROPOFOL ;  Surgeon: Hollingshead Lamar CHRISTELLA, MD;  [facility];  Service: Endoscopy;  Laterality: N/A;  am   LT KNEE SURGERY     POLYPECTOMY  06/06/2017   Procedure: POLYPECTOMY;  Surgeon: Hollingshead Lamar CHRISTELLA, MD;  [facility];  Service: Endoscopy;;  Ascending colon x2 (HS)   POLYPECTOMY  01/13/2021   Procedure: POLYPECTOMY INTESTINAL;  Surgeon: Hollingshead Lamar CHRISTELLA, MD;  [facility];  Service: Endoscopy;;    SHOULDER SURGERY     thumb surger     WRIST SURGERY     Patient Active Problem List   Diagnosis Date Noted   Genetic testing 02/27/2022   Invasive lobular carcinoma of right breast in female [facility]) 11/22/2021   Hypertension, essential 11/18/2021   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 ER/PR positive, HER2 negative. Rt lumpectomy and SLNB 0/4 LN positive with Dr. Ebbie on 12/12/21 followed by XRT? and antiestrogen therapy. 70cc drained from axillary seroma on 12/27/21  PRECAUTIONS: right UE Lymphedema risk, None  SUBJECTIVE: Pt returns for her first 6 month L-Dex screen.   PAIN:  Are you having pain? No  SOZO SCREENING: Patient was assessed today using the SOZO machine to determine the lymphedema index score. This was compared to her baseline score. It was determined that she is within the recommended range when compared to her baseline and no further action is needed at this time. She will continue SOZO screenings. These are done every 3 months for 2 years post operatively followed by every 6 months for 2 years, and then annually.   L-DEX  FLOWSHEETS - 07/21/24 0900       L-DEX LYMPHEDEMA SCREENING   Measurement Type Unilateral    L-DEX MEASUREMENT EXTREMITY Upper Extremity    POSITION  Standing    DOMINANT SIDE Right    At Risk Side Right    BASELINE SCORE (UNILATERAL) 3.3    L-DEX SCORE (UNILATERAL) 2.4    VALUE CHANGE (UNILAT) -0.9         P: Cont 6 month L-Dex screens until 11/2025.  Aden Berwyn Caldron, PTA 07/21/2024, 9:57 AM

## 2024-10-07 ENCOUNTER — Ambulatory Visit (HOSPITAL_COMMUNITY)

## 2024-10-07 ENCOUNTER — Encounter (HOSPITAL_COMMUNITY)

## 2024-12-08 ENCOUNTER — Inpatient Hospital Stay

## 2024-12-15 ENCOUNTER — Inpatient Hospital Stay: Admitting: Physician Assistant

## 2025-01-26 ENCOUNTER — Ambulatory Visit

## 2025-01-26 ENCOUNTER — Ambulatory Visit: Attending: General Surgery
